# Patient Record
Sex: Female | Born: 1960 | Race: White | Hispanic: No | Marital: Married | Smoking: Former smoker
Health system: Southern US, Community
[De-identification: ages and names within clinical notes are randomized; demographics above are authoritative.]

## PROBLEM LIST (undated history)

## (undated) DIAGNOSIS — G35 Multiple sclerosis: Secondary | ICD-10-CM

## (undated) DIAGNOSIS — E785 Hyperlipidemia, unspecified: Secondary | ICD-10-CM

## (undated) DIAGNOSIS — E079 Disorder of thyroid, unspecified: Secondary | ICD-10-CM

## (undated) DIAGNOSIS — R011 Cardiac murmur, unspecified: Secondary | ICD-10-CM

## (undated) DIAGNOSIS — F32A Depression, unspecified: Secondary | ICD-10-CM

## (undated) DIAGNOSIS — T7840XA Allergy, unspecified, initial encounter: Secondary | ICD-10-CM

## (undated) DIAGNOSIS — C801 Malignant (primary) neoplasm, unspecified: Secondary | ICD-10-CM

## (undated) HISTORY — DX: Allergy, unspecified, initial encounter: T78.40XA

## (undated) HISTORY — DX: Depression, unspecified: F32.A

## (undated) HISTORY — DX: Multiple sclerosis: G35

## (undated) HISTORY — DX: Disorder of thyroid, unspecified: E07.9

## (undated) HISTORY — DX: Cardiac murmur, unspecified: R01.1

## (undated) HISTORY — DX: Hyperlipidemia, unspecified: E78.5

---

## 1998-07-29 HISTORY — PX: CHOLECYSTECTOMY: SHX55

## 1998-08-24 ENCOUNTER — Other Ambulatory Visit: Admission: RE | Admit: 1998-08-24 | Discharge: 1998-08-24 | Payer: Self-pay | Admitting: Obstetrics and Gynecology

## 2004-07-29 DIAGNOSIS — Z9089 Acquired absence of other organs: Secondary | ICD-10-CM

## 2004-07-29 DIAGNOSIS — E890 Postprocedural hypothyroidism: Secondary | ICD-10-CM

## 2004-07-29 HISTORY — DX: Postprocedural hypothyroidism: E89.0

## 2004-07-29 HISTORY — DX: Acquired absence of other organs: Z90.89

## 2005-04-02 ENCOUNTER — Encounter: Admission: RE | Admit: 2005-04-02 | Discharge: 2005-04-02 | Payer: Self-pay | Admitting: Obstetrics and Gynecology

## 2006-04-07 ENCOUNTER — Ambulatory Visit (HOSPITAL_COMMUNITY): Admission: RE | Admit: 2006-04-07 | Discharge: 2006-04-07 | Payer: Self-pay | Admitting: Obstetrics and Gynecology

## 2006-07-29 HISTORY — PX: OTHER SURGICAL HISTORY: SHX169

## 2006-08-06 ENCOUNTER — Other Ambulatory Visit: Admission: RE | Admit: 2006-08-06 | Discharge: 2006-08-06 | Payer: Self-pay | Admitting: Surgery

## 2006-09-08 ENCOUNTER — Encounter (INDEPENDENT_AMBULATORY_CARE_PROVIDER_SITE_OTHER): Payer: Self-pay | Admitting: Specialist

## 2006-09-08 ENCOUNTER — Ambulatory Visit (HOSPITAL_COMMUNITY): Admission: RE | Admit: 2006-09-08 | Discharge: 2006-09-09 | Payer: Self-pay | Admitting: Surgery

## 2007-03-30 ENCOUNTER — Ambulatory Visit: Payer: Self-pay | Admitting: Oncology

## 2007-04-07 ENCOUNTER — Ambulatory Visit: Payer: Self-pay | Admitting: Internal Medicine

## 2007-04-16 ENCOUNTER — Ambulatory Visit: Payer: Self-pay | Admitting: Internal Medicine

## 2007-04-27 ENCOUNTER — Ambulatory Visit: Payer: Self-pay | Admitting: Oncology

## 2007-04-29 ENCOUNTER — Ambulatory Visit: Payer: Self-pay | Admitting: Oncology

## 2007-05-30 ENCOUNTER — Ambulatory Visit: Payer: Self-pay | Admitting: Oncology

## 2007-12-11 DIAGNOSIS — Z86018 Personal history of other benign neoplasm: Secondary | ICD-10-CM

## 2007-12-11 HISTORY — DX: Personal history of other benign neoplasm: Z86.018

## 2008-07-01 ENCOUNTER — Encounter: Admission: RE | Admit: 2008-07-01 | Discharge: 2008-07-01 | Payer: Self-pay | Admitting: Obstetrics and Gynecology

## 2009-01-12 ENCOUNTER — Encounter: Admission: RE | Admit: 2009-01-12 | Discharge: 2009-01-12 | Payer: Self-pay | Admitting: Obstetrics and Gynecology

## 2009-05-26 DIAGNOSIS — T7840XA Allergy, unspecified, initial encounter: Secondary | ICD-10-CM

## 2009-05-26 HISTORY — DX: Allergy, unspecified, initial encounter: T78.40XA

## 2009-07-27 ENCOUNTER — Ambulatory Visit: Payer: Self-pay | Admitting: Family Medicine

## 2010-01-17 DIAGNOSIS — C4491 Basal cell carcinoma of skin, unspecified: Secondary | ICD-10-CM

## 2010-01-17 HISTORY — DX: Basal cell carcinoma of skin, unspecified: C44.91

## 2010-01-30 DIAGNOSIS — E669 Obesity, unspecified: Secondary | ICD-10-CM | POA: Insufficient documentation

## 2010-08-19 ENCOUNTER — Encounter: Payer: Self-pay | Admitting: Obstetrics and Gynecology

## 2010-11-10 ENCOUNTER — Ambulatory Visit: Payer: Self-pay | Admitting: Internal Medicine

## 2010-12-14 NOTE — Op Note (Signed)
Erica Noble, Erica Noble                ACCOUNT NO.:  [id number]   MEDICAL RECORD NO.:  [id number]          PATIENT TYPE:  OIB   LOCATION:  5705                         FACILITY:  [facility]   PHYSICIAN:  Velora Heckler, MD      DATE OF BIRTH:  [birth year]   DATE OF PROCEDURE:  09/08/2006  DATE OF DISCHARGE:                               OPERATIVE REPORT   PREOPERATIVE DIAGNOSIS:  Thyroid nodule with cytologic atypia.   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   PROCEDURE:  Left thyroid lobectomy.   SURGEON:  Velora Heckler, MD, FACS   ASSISTANT:  Francina Ames, MD, FACS   ANESTHESIA:  General.   ANESTHESIOLOGIST:  Kipp Brood, MD   ESTIMATED BLOOD LOSS:  Minimal.   PREPARATION:  Betadine.   COMPLICATIONS:  None.   INDICATIONS:  The patient is a 50 year old white female with known  thyroid nodule.  The patient was followed since October 2007.  In  January 2008, she underwent fine-needle aspiration cytology.  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  The patient  now comes to surgery for resection for definitive diagnosis.   BODY OF REPORT:  Procedure is done in OR #10 at the [facility].  The patient is brought to the operating room and  placed in a supine position on the operating room table.  Following the  administration of general anesthesia, the patient is positioned and then  prepped and draped in the usual strict aseptic fashion.  After  ascertaining that an adequate level of anesthesia had been obtained, a  Kocher incision is made with a #15 blade.  Dissection is carried down  through subcutaneous tissues and platysma.  Hemostasis is obtained with  the electrocautery.  Skin flaps are elevated cephalad and caudad from  the thyroid notch to the sternal notch.  The Mahorner self-retaining  retractor is placed for exposure.  Strap muscles are incised in the  midline and the dissection is  begun on the left side of the neck.  Upon  opening the strap muscles, there is a prominent inflammatory-appearing  lymph node just below the isthmus of the thyroid in the upper portion of  the central compartment.  It is excised and submitted as central  compartment lymph node.  Strap muscles are then reflected laterally and  the left thyroid lobe is exposed.  Lobe is slightly larger than normal.  It is quite firm.  It has a blanched appearance, all consistent with  thyroiditis.  No other lymphadenopathy is palpable.  Venous tributaries  are divided with the harmonic scalpel.  Lobe is gently dissected out  with a Barista.  Superior pole vessels are divided with medium  Ligaclips and use of the harmonic scalpel.  Gland is rolled anteriorly.  Branches of the inferior thyroid artery are dissected out and divided  with the harmonic scalpel and between small Ligaclips.  Recurrent nerve  is identified and preserved.  Superior parathyroid glands is identified  on the left and preserved.  Inferior parathyroid gland is  not identified  and likely was below the operative field.  Gland is rolled anteriorly.  Further branches of the inferior thyroid artery are divided between  small Ligaclips.  Ligament of Allyson Sabal is incised with the electrocautery,  taking care to avoid the recurrent nerve.  Isthmus of the gland is  mobilized across the midline.  There is no pyramidal lobe.  Isthmus is  transected at its junction with the right thyroid lobe using the  harmonic scalpel.  Specimen is submitted to Pathology, where Dr. Jimmy Picket confirms a diffuse thyroiditis.  The nodule has a thick fibrous  capsule.  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  No overt signs of malignancy  are identified.   The right lobe is inspected.  Strap muscles are reflected laterally and  the right lobe palpated.  It has an identical appearance and character  to the left lobe, all consistent with diffuse thyroiditis.  No  dominant  masses or nodules are identified on the right.  No lymphadenopathy is  present on the right.   Left neck is irrigated.  Surgicel is placed over the recurrent nerve and  parathyroid gland.  Strap muscles are reapproximated in the midline with  interrupted 3-0 Vicryl sutures.  Platysma is closed with interrupted 3-0  Vicryl sutures.  Skin is closed with a running 4-0 Vicryl subcuticular  suture.  Wound is washed and dried and Benzoin and Steri-Strips are  applied.  Sterile dressings are applied.  The patient is awakened from  anesthesia and brought to the recovery room in stable condition.  The  patient tolerated the procedure well.      Velora Heckler, MD  Electronically Signed     TMG/MEDQ  D:  09/08/2006  T:  09/09/2006  Job:  [id number]   cc:   Juluis Mire, M.D.

## 2011-03-23 ENCOUNTER — Ambulatory Visit: Payer: Self-pay | Admitting: Family Medicine

## 2011-07-18 ENCOUNTER — Other Ambulatory Visit: Payer: Self-pay | Admitting: Obstetrics and Gynecology

## 2011-07-18 DIAGNOSIS — R928 Other abnormal and inconclusive findings on diagnostic imaging of breast: Secondary | ICD-10-CM

## 2011-08-01 ENCOUNTER — Ambulatory Visit
Admission: RE | Admit: 2011-08-01 | Discharge: 2011-08-01 | Disposition: A | Discharge status: Home / Self Care | Payer: 59 | Source: Ambulatory Visit | Attending: Obstetrics and Gynecology | Admitting: Obstetrics and Gynecology

## 2011-08-01 DIAGNOSIS — R928 Other abnormal and inconclusive findings on diagnostic imaging of breast: Secondary | ICD-10-CM

## 2011-10-27 ENCOUNTER — Ambulatory Visit: Payer: Self-pay | Admitting: Family Medicine

## 2011-11-18 ENCOUNTER — Other Ambulatory Visit: Payer: Self-pay | Admitting: Gastroenterology

## 2011-12-20 ENCOUNTER — Other Ambulatory Visit: Payer: Self-pay | Admitting: Obstetrics and Gynecology

## 2011-12-20 DIAGNOSIS — N63 Unspecified lump in unspecified breast: Secondary | ICD-10-CM

## 2012-02-04 ENCOUNTER — Ambulatory Visit
Admission: RE | Admit: 2012-02-04 | Discharge: 2012-02-04 | Disposition: A | Discharge status: Home / Self Care | Payer: 59 | Source: Ambulatory Visit | Attending: Obstetrics and Gynecology | Admitting: Obstetrics and Gynecology

## 2012-02-04 DIAGNOSIS — N63 Unspecified lump in unspecified breast: Secondary | ICD-10-CM

## 2012-07-02 ENCOUNTER — Other Ambulatory Visit: Payer: Self-pay | Admitting: Obstetrics and Gynecology

## 2012-07-02 DIAGNOSIS — N63 Unspecified lump in unspecified breast: Secondary | ICD-10-CM

## 2012-07-29 HISTORY — PX: BREAST BIOPSY: SHX20

## 2012-08-10 ENCOUNTER — Ambulatory Visit
Admission: RE | Admit: 2012-08-10 | Discharge: 2012-08-10 | Disposition: A | Discharge status: Home / Self Care | Payer: 59 | Source: Ambulatory Visit | Attending: Obstetrics and Gynecology | Admitting: Obstetrics and Gynecology

## 2012-08-10 ENCOUNTER — Other Ambulatory Visit: Payer: Self-pay | Admitting: Obstetrics and Gynecology

## 2012-08-10 DIAGNOSIS — N63 Unspecified lump in unspecified breast: Secondary | ICD-10-CM

## 2012-12-31 LAB — HM HEPATITIS C SCREENING LAB: HM HEPATITIS C SCREENING: NEGATIVE

## 2013-07-16 ENCOUNTER — Other Ambulatory Visit: Payer: Self-pay

## 2013-07-16 DIAGNOSIS — Z1231 Encounter for screening mammogram for malignant neoplasm of breast: Secondary | ICD-10-CM

## 2013-08-11 ENCOUNTER — Ambulatory Visit
Admission: RE | Admit: 2013-08-11 | Discharge: 2013-08-11 | Disposition: A | Discharge status: Home / Self Care | Payer: 59 | Source: Ambulatory Visit

## 2013-08-11 DIAGNOSIS — Z1231 Encounter for screening mammogram for malignant neoplasm of breast: Secondary | ICD-10-CM

## 2013-09-22 ENCOUNTER — Encounter: Payer: Self-pay | Admitting: Neurology

## 2013-09-26 ENCOUNTER — Encounter: Payer: Self-pay | Admitting: Neurology

## 2013-10-27 ENCOUNTER — Encounter: Payer: Self-pay | Admitting: Neurology

## 2014-07-04 ENCOUNTER — Other Ambulatory Visit: Payer: Self-pay

## 2014-07-04 DIAGNOSIS — Z1231 Encounter for screening mammogram for malignant neoplasm of breast: Secondary | ICD-10-CM

## 2014-07-25 ENCOUNTER — Other Ambulatory Visit: Payer: Self-pay | Admitting: Obstetrics and Gynecology

## 2014-07-26 LAB — CYTOLOGY - PAP

## 2014-08-02 LAB — BASIC METABOLIC PANEL
BUN: 11 mg/dL (ref 4–21)
Creatinine: 0.7 mg/dL (ref 0.5–1.1)
Glucose: 116 mg/dL
POTASSIUM: 4.1 mmol/L (ref 3.4–5.3)
SODIUM: 139 mmol/L (ref 137–147)

## 2014-08-02 LAB — LIPID PANEL
CHOLESTEROL: 161 mg/dL (ref 0–200)
HDL: 59 mg/dL (ref 35–70)
LDL Cholesterol: 88 mg/dL
Triglycerides: 71 mg/dL (ref 40–160)

## 2014-08-02 LAB — CBC AND DIFFERENTIAL
HCT: 42 % (ref 36–46)
HEMOGLOBIN: 14.4 g/dL (ref 12.0–16.0)
Platelets: 241 10*3/uL (ref 150–399)
WBC: 7.9 10*3/mL

## 2014-08-02 LAB — TSH: TSH: 0.86 u[IU]/mL (ref 0.41–5.90)

## 2014-08-02 LAB — HEPATIC FUNCTION PANEL
ALT: 18 U/L (ref 7–35)
AST: 18 U/L (ref 13–35)

## 2014-08-12 ENCOUNTER — Ambulatory Visit
Admission: RE | Admit: 2014-08-12 | Discharge: 2014-08-12 | Disposition: A | Discharge status: Home / Self Care | Payer: 59 | Source: Ambulatory Visit

## 2014-08-12 DIAGNOSIS — Z1231 Encounter for screening mammogram for malignant neoplasm of breast: Secondary | ICD-10-CM

## 2015-01-22 LAB — LIPID PANEL
CHOLESTEROL: 156 mg/dL (ref 0–200)
HDL: 57 mg/dL (ref 35–70)
LDL CALC: 86 mg/dL
Triglycerides: 64 mg/dL (ref 40–160)

## 2015-01-22 LAB — HEMOGLOBIN A1C: HEMOGLOBIN A1C: 5.6

## 2015-03-09 ENCOUNTER — Ambulatory Visit: Payer: 59 | Attending: Neurology | Admitting: Physical Therapy

## 2015-03-09 ENCOUNTER — Encounter: Payer: Self-pay | Admitting: Physical Therapy

## 2015-03-09 DIAGNOSIS — M6281 Muscle weakness (generalized): Secondary | ICD-10-CM | POA: Diagnosis present

## 2015-03-09 DIAGNOSIS — R262 Difficulty in walking, not elsewhere classified: Secondary | ICD-10-CM | POA: Diagnosis present

## 2015-03-09 NOTE — Therapy (Signed)
[facility] [address] [place], [place], [address] Phone: [phone]   Fax:  [phone]  Physical Therapy Evaluation  Patient Details  Name: Erica Noble MRN: [id number] Date of Birth: 20-Apr-1961 Referring Provider:  Vladimir Crofts, MD  Encounter Date: 03/09/2015      PT End of Session - 03/10/15 1544    Visit Number 1   Number of Visits 8   Date for PT Re-Evaluation 04/06/15   PT Start Time 4580   PT Stop Time 1617   PT Time Calculation (min) 56 min   Equipment Utilized During Treatment Gait belt   Activity Tolerance Patient tolerated treatment well;Patient limited by fatigue   Behavior During Therapy [facility] for tasks assessed/performed      Past Medical History  Diagnosis Date  . Multiple sclerosis     2008 diagnosis    No past surgical history on file.  There were no vitals filed for this visit.  Visit Diagnosis:  Muscle weakness  Difficulty walking      Subjective Assessment - 03/09/15 1531    Subjective Pt. reports falling 1 month ago while walking on driveway with no assistive device.  Pt. has h/o progressive worsening gait due to MS diagnosis.  Several falls reported and pt. not using assistive device at this time.    Patient is accompained by: Family member   Limitations Lifting;Standing;Walking   Patient Stated Goals improve LE muscle strength/ walking/ safety.   Currently in Pain? No/denies            [facility] PT Assessment - 03/10/15 0001    Assessment   Medical Diagnosis multiple sclerosis   Onset Date/Surgical Date 02/27/07   Prior Therapy see PT notes from 11/2013   Balance Screen   Has the patient fallen in the past 6 months Yes   How many times? 3   Has the patient had a decrease in activity level because of a fear of falling?  Yes   Is the patient reluctant to leave their home because of a fear of falling?  Yes      Berg: 38/56        PT Education - 03/10/15 1543    Education  provided Yes   Education Details HEP and instructed in proper assistive device (rollator) with all walking tasks inside/outside.    Person(s) Educated Patient   Methods Explanation;Demonstration;Handout   Comprehension Verbalized understanding;Returned demonstration;Tactile cues required;Need further instruction             PT Long Term Goals - 03/10/15 1554    PT LONG TERM GOAL #1   Title Pt. I with HEP to increase B LE muscle strength to grossly 4+/5 MMT to improve hip flexion/ gait pattern/ prevent falls.     Time 4   Period Weeks   Status New   PT LONG TERM GOAL #2   Title Pt. able to ambulate community distances with consistent hip flexion/ step pattern and length with least assistive device on level surfaces safely.     Time 4   Period Weeks   Status New   PT LONG TERM GOAL #3   Title Pt. will increase Berg balance test to >44 out of 56 to decrease fall risk/ promote safety with walkiing.    Baseline 38/56 on 8/11   Time 4   Period Weeks   Status New               Plan -  03/10/15 1546    Clinical Impression Statement Pt. is a pleasant 54 y/o female with progressive worsening gait/ h/o falls.  Pt. reports last fall on driveway 1 month ago without injury.  Pt. presents with generalized B LE muscle weakness: R LE muscle strength grossly 4+/5 and L LE 4/5 MMT except B DF/hip flexion grossly 3/5 MMT (limited hip flexion during gait/ step pattern).  Pt. scored a 38/56 on Berg balance test and benefits greatly from use of rollator.  Pt. ambulates with shuffling/festinating gait pattern with poor hip flexion/ step pattern/ heel strike.  Poor ability to turn without UE assist/ device in clinic.  Pt. will benefit from skilled PT services to increase B LE muscle strength to improve gait pattern/ safely/ use of appropriate assistive device.      Pt will benefit from skilled therapeutic intervention in order to improve on the following deficits Abnormal gait;Decreased  endurance;Obesity;Decreased activity tolerance;Pain;Impaired UE functional use;Decreased strength;Decreased balance;Decreased mobility;Difficulty walking;Improper body mechanics;Decreased range of motion;Decreased coordination;Decreased safety awareness;Postural dysfunction   Rehab Potential Fair   PT Frequency 2x / week   PT Duration 4 weeks   PT Treatment/Interventions ADLs/Self Care Home Management;Neuromuscular re-education;Aquatic Therapy;Passive range of motion;Patient/family education;Gait training;Stair training;Functional mobility training;Therapeutic activities;Therapeutic exercise;Balance training;Manual techniques;Energy conservation   PT Next Visit Plan Reassess use of rollator/ balance training/ B LE strengthening ex.    PT Home Exercise Plan see handouts.    Recommended Other Services walking program.    Consulted and Agree with Plan of Care Patient         Problem List There are no active problems to display for this patient.  Pura Spice, PT, DPT # [phone]   03/10/2015, 4:01 PM  [place] [facility] [address] [place], [place], [address] Phone: [phone]   Fax:  [phone]

## 2015-03-17 DIAGNOSIS — B029 Zoster without complications: Secondary | ICD-10-CM | POA: Insufficient documentation

## 2015-03-17 DIAGNOSIS — Z79899 Other long term (current) drug therapy: Secondary | ICD-10-CM | POA: Insufficient documentation

## 2015-03-17 DIAGNOSIS — R5381 Other malaise: Secondary | ICD-10-CM | POA: Insufficient documentation

## 2015-03-17 DIAGNOSIS — E559 Vitamin D deficiency, unspecified: Secondary | ICD-10-CM | POA: Insufficient documentation

## 2015-03-17 DIAGNOSIS — E039 Hypothyroidism, unspecified: Secondary | ICD-10-CM | POA: Insufficient documentation

## 2015-03-17 DIAGNOSIS — Z86010 Personal history of colonic polyps: Secondary | ICD-10-CM | POA: Insufficient documentation

## 2015-03-17 DIAGNOSIS — R5383 Other fatigue: Secondary | ICD-10-CM

## 2015-03-17 DIAGNOSIS — N951 Menopausal and female climacteric states: Secondary | ICD-10-CM | POA: Insufficient documentation

## 2015-03-17 DIAGNOSIS — E785 Hyperlipidemia, unspecified: Secondary | ICD-10-CM | POA: Insufficient documentation

## 2015-03-17 DIAGNOSIS — G35 Multiple sclerosis: Secondary | ICD-10-CM | POA: Insufficient documentation

## 2015-03-17 DIAGNOSIS — E079 Disorder of thyroid, unspecified: Secondary | ICD-10-CM | POA: Insufficient documentation

## 2015-03-22 ENCOUNTER — Ambulatory Visit: Payer: 59 | Admitting: Physical Therapy

## 2015-03-22 ENCOUNTER — Encounter: Payer: Self-pay | Admitting: Physical Therapy

## 2015-03-22 ENCOUNTER — Encounter: Payer: Self-pay | Admitting: Family Medicine

## 2015-03-22 DIAGNOSIS — M6281 Muscle weakness (generalized): Secondary | ICD-10-CM

## 2015-03-22 DIAGNOSIS — R262 Difficulty in walking, not elsewhere classified: Secondary | ICD-10-CM

## 2015-03-22 NOTE — Therapy (Signed)
[facility] [address] [place], [place], [address] Phone: [phone]   Fax:  [phone]  Physical Therapy Treatment  Patient Details  Name: Erica Noble MRN: [id number] Date of Birth: 04-30-1961 Referring Provider:  Vladimir Crofts, MD  Encounter Date: 03/22/2015      PT End of Session - 03/22/15 1732    Visit Number 2   Number of Visits 8   Date for PT Re-Evaluation 04/05/15   PT Start Time 1301   PT Stop Time 6948   PT Time Calculation (min) 53 min   Equipment Utilized During Treatment Gait belt   Activity Tolerance Patient tolerated treatment well;No increased pain;Patient limited by fatigue   Behavior During Therapy [facility] for tasks assessed/performed      Past Medical History  Diagnosis Date  . Multiple sclerosis     2008 diagnosis  . Allergy 05/26/2009    Allergic rhinitis  . Hyperlipidemia     Past Surgical History  Procedure Laterality Date  . History of thyroidectomy  2008  . Cholecystectomy  2000    There were no vitals filed for this visit.  Visit Diagnosis:  Muscle weakness  Difficulty walking      Subjective Assessment - 03/22/15 1352    Subjective Pt reports no falls since last PT tx session. Pt reports not using an AD inside the house or when walking her driveway. Pt reports no pain and per pt's husband, pt is doing well overall. Pt has not attempted to get rollator since getting the RX from her MD.   Patient is accompained by: Family member   Limitations Lifting;Standing;Walking   Currently in Pain? No/denies        OBJECTIVE: Gait training: cones on alternating sides for step length cues with CGA x 8 (emphasis on heel strike and decrease festinating gait pattern).  Turns around cones with CGA to decrease shuffling pattern noticed. Gait training around the gym with CGA with minimal carryover from visual cone cues (pt required constant verbal cues for heel strike and no shuffling gait). Gait  to the car with moderate verbal cuing and CGA to increase step length and heel strike. There ex: steps overs 3" step in // bars with one UE support/ step ups onto 3" step in // bars with one UE support x 10 each (pt quick to fatigue with step ups). Nu step for 10 minutes at L6 (slow speed and encouragement to keep a steady speed for whole time, no charge). Neuro re-ed: balance on airex with EO/EC 30 seconds x 3 (no LOB but postural sway noted with EC, pt reported feeling unsteady with EC). Balance on airex with dynamic UE movement to catch and throw the ball x 3 mins (no LOB).   Pt response to Tx for medical necessity: Pt continues to benefit from balance and gait training to improve her safety with functional mobility and maintain her current functional level with the progressive nature of MS.         PT Education - 03/22/15 1731    Education provided Yes   Education Details Pt encouraged to ambulate with SPC until she gets the rollator instead of furniture surfing at home. Pt educated on importance of not walking without an AD due to gait pattern and balance concerns.    Person(s) Educated Patient;Spouse   Methods Explanation   Comprehension Verbalized understanding           PT Long Term Goals - 03/10/15  [place] GOAL #1   Title Pt. I with HEP to increase B LE muscle strength to grossly 4+/5 MMT to improve hip flexion/ gait pattern/ prevent falls.     Time 4   Period Weeks   Status New   PT LONG TERM GOAL #2   Title Pt. able to ambulate community distances with consistent hip flexion/ step pattern and length with least assistive device on level surfaces safely.     Time 4   Period Weeks   Status New   PT LONG TERM GOAL #3   Title Pt. will increase Berg balance test to >44 out of 56 to decrease fall risk/ promote safety with walkiing.    Baseline 38/56 on 8/11   Time 4   Period Weeks   Status New           Plan - 03/22/15 1733    Clinical Impression Statement  Pt limited in endurance with gait and standing actitivies today. Pt reports doing better in the AM so appointments shifted to AM. Pt ambulates with shuffled, step to, festinating gait pattern without verbal and visual cues to lengthen step length. Moderate carryover without cues. Pt demonstrated proper balance responses to balance challenges on airex pad. Pt educated on importance of using AD at home. Pt continues to benefit from PT to address balance and gait deficits.   Pt will benefit from skilled therapeutic intervention in order to improve on the following deficits Abnormal gait;Difficulty walking;Decreased coordination;Decreased safety awareness;Decreased endurance;Decreased activity tolerance;Decreased balance;Decreased mobility;Decreased strength;Improper body mechanics   Rehab Potential Fair   PT Frequency 2x / week   PT Duration 4 weeks   PT Treatment/Interventions ADLs/Self Care Home Management;Cryotherapy;Moist Heat;Balance training;Therapeutic exercise;Therapeutic activities;Functional mobility training;Stair training;Gait training;Patient/family education;Neuromuscular re-education;Passive range of motion;Manual techniques   PT Next Visit Plan reassess gait pattern/continue to work on gait and balance exercises, attempt treadmill before pt tries at home.   PT Home Exercise Plan continue current HEP progress at next session   Recommended Other Services aquatic therapy, walking program    Consulted and Agree with Plan of Care Patient        Problem List Patient Active Problem List   Diagnosis Date Noted  . Polypharmacy 03/17/2015  . History of colon polyps 03/17/2015  . HLD (hyperlipidemia) 03/17/2015  . Adult hypothyroidism 03/17/2015  . Malaise and fatigue 03/17/2015  . DS (disseminated sclerosis) 03/17/2015  . Post menopausal syndrome 03/17/2015  . Herpes zona 03/17/2015  . Disease of thyroid gland 03/17/2015  . Avitaminosis D 03/17/2015  . Back ache 01/30/2010  .  Adiposity 01/30/2010  . Acquired hypothyroidism 05/26/2009  . Allergic rhinitis 05/26/2009   Lavone Neri, SPT  03/22/2015, 5:53 PM  [facility] [address] [place], [place], [address] Phone: [phone]   Fax:  [phone]

## 2015-03-29 ENCOUNTER — Encounter: Payer: Self-pay | Admitting: Physical Therapy

## 2015-03-29 ENCOUNTER — Ambulatory Visit: Payer: 59 | Admitting: Physical Therapy

## 2015-03-29 DIAGNOSIS — M6281 Muscle weakness (generalized): Secondary | ICD-10-CM | POA: Diagnosis not present

## 2015-03-29 DIAGNOSIS — R262 Difficulty in walking, not elsewhere classified: Secondary | ICD-10-CM

## 2015-03-29 NOTE — Therapy (Signed)
[facility] [address] [place], [place], [address] Phone: [phone]   Fax:  [phone]  Physical Therapy Treatment  Patient Details  Name: ODESSER TOURANGEAU MRN: [id number] Date of Birth: 1961-04-23 Referring Provider:  Vladimir Crofts, MD  Encounter Date: 03/29/2015      PT End of Session - 03/29/15 1230    Visit Number 3   Number of Visits 8   Date for PT Re-Evaluation 04/05/15   PT Start Time 1014   PT Stop Time 1101   PT Time Calculation (min) 47 min   Equipment Utilized During Treatment Gait belt   Activity Tolerance Patient tolerated treatment well;Patient limited by fatigue   Behavior During Therapy [facility] for tasks assessed/performed      Past Medical History  Diagnosis Date  . Multiple sclerosis     2008 diagnosis  . Allergy 05/26/2009    Allergic rhinitis  . Hyperlipidemia     Past Surgical History  Procedure Laterality Date  . History of thyroidectomy  2008  . Cholecystectomy  2000    There were no vitals filed for this visit.  Visit Diagnosis:  Muscle weakness  Difficulty walking      Subjective Assessment - 03/29/15 1102    Subjective Pt reports no falls since last PT tx session. Pt reports not using an AD inside the house or when walking her driveway. Pt reports no pain but fatigue today upon arrival to PT session. Pt states she feels like a rollator is cumbersome and not worth using. Pt encouraged to use rollator for all functional mobility.    Patient is accompained by: Family member   Limitations Lifting;Standing;Walking   Patient Stated Goals improve LE muscle strength/ walking/ safety.   Currently in Pain? No/denies       OBJECTIVE: There ex: Nustep L6 5 mins warm up focusing on R knee extension (no charge). Side step overs 3" step in // bars with B UE support (emphasis on toes staying forward and larger step length/increased hip flexion and abduction). In // bars, walking marching to  increase hip flexion and knee flexion. Gait: In // bars with no UE support, fast walking and then controlled heel to toe gait pattern emphasis to decrease shuffling/foot drag. In the hallway, gait with rollator and CGA (emphasis on heel strike, hip flexion; poor carryover without verbal cuing). Gait outside on the asphalt with rollator and CGA to work on [employer] (cuing to decrease toe drag and equal step length, poor carryover).  Standing rocker board L/R/ forward/backwards with UE assist working on wt. Shifting/ posture.   Pt response for medical necessity: Pt ambulates safer and at a functional gait speed with rollator. Continue to work to decrease shuffling gait and increase balance and strength to increase independence and safety with mobility.        PT Education - 03/29/15 1103    Education provided Yes   Education Details Pt educated on importance of using a rollator for functional mobility and safety.    Person(s) Educated Patient;Spouse   Methods Explanation;Demonstration;Verbal cues   Comprehension Returned demonstration;Verbalized understanding;Need further instruction            PT Long Term Goals - 03/10/15 1554    PT LONG TERM GOAL #1   Title Pt. I with HEP to increase B LE muscle strength to grossly 4+/5 MMT to improve hip flexion/ gait pattern/ prevent falls.     Time 4   Period Weeks  Status New   PT LONG TERM GOAL #2   Title Pt. able to ambulate community distances with consistent hip flexion/ step pattern and length with least assistive device on level surfaces safely.     Time 4   Period Weeks   Status New   PT LONG TERM GOAL #3   Title Pt. will increase Berg balance test to >44 out of 56 to decrease fall risk/ promote safety with walkiing.    Baseline 38/56 on 8/11   Time 4   Period Weeks   Status New            Plan - 03/29/15 1109    Clinical Impression Statement Pt ambulates with no assistive device with decreased knee and hip flexion. Pt  ambulates with decreased dorsiflexion and decreased stride and step length bilaterally. With Rollator and CGA, pt ambulates with increased cadence and step through gait pattern. Pt inconsistently demonstrates increased dorsiflexion and knee flexion in order to clear her toes. Without verbal cuing, pt returns to shuffled/festinating gait pattern. With the rollator pt is able to turn inconsistently without shuffling her feet. Without verbal cues, pt returns to gait pattern of preference. Pt demonstrates lack of ability to properly weight shift on rocker board from side to side without UE support and at a normal gait speed.    Pt will benefit from skilled therapeutic intervention in order to improve on the following deficits Abnormal gait;Difficulty walking;Decreased range of motion;Decreased coordination;Decreased endurance;Decreased balance;Decreased activity tolerance;Decreased mobility;Decreased strength;Obesity;Hypomobility   Rehab Potential Fair   PT Frequency 1x / week   PT Duration 4 weeks   PT Treatment/Interventions ADLs/Self Care Home Management;Cryotherapy;Moist Heat;Balance training;Therapeutic exercise;Therapeutic activities;Manual techniques;Functional mobility training;Stair training;Gait training;Patient/family education;Neuromuscular re-education;Passive range of motion   PT Next Visit Plan progress gait with rollator/balance and strengthening espeically L LE/CALL NEXT Wednesday to follow up   PT Home Exercise Plan continue walking and home program    Consulted and Agree with Plan of Care Patient;Family member/caregiver        Problem List Patient Active Problem List   Diagnosis Date Noted  . Polypharmacy 03/17/2015  . History of colon polyps 03/17/2015  . HLD (hyperlipidemia) 03/17/2015  . Adult hypothyroidism 03/17/2015  . Malaise and fatigue 03/17/2015  . DS (disseminated sclerosis) 03/17/2015  . Post menopausal syndrome 03/17/2015  . Herpes zona 03/17/2015  . Disease of  thyroid gland 03/17/2015  . Avitaminosis D 03/17/2015  . Back ache 01/30/2010  . Adiposity 01/30/2010  . Acquired hypothyroidism 05/26/2009  . Allergic rhinitis 05/26/2009    Pura Spice, SPT 03/29/2015, 12:43 PM  [facility] [address] [place], [place], [address] Phone: [phone]   Fax:  [phone]

## 2015-05-03 ENCOUNTER — Ambulatory Visit (INDEPENDENT_AMBULATORY_CARE_PROVIDER_SITE_OTHER): Payer: 59 | Admitting: Family Medicine

## 2015-05-03 VITALS — BP 126/72 | HR 62 | Temp 98.3°F | Resp 14 | Ht 65.0 in | Wt 231.0 lb

## 2015-05-03 DIAGNOSIS — Z6838 Body mass index (BMI) 38.0-38.9, adult: Secondary | ICD-10-CM | POA: Diagnosis not present

## 2015-05-03 DIAGNOSIS — G35 Multiple sclerosis: Secondary | ICD-10-CM

## 2015-05-03 NOTE — Progress Notes (Signed)
Patient ID: Erica Noble, female   DOB: 05-19-1961, 54 y.o.   MRN: [id number]   Erica Noble  MRN: [id number] DOB: 10-29-1960  Subjective:  HPI  1. BMI 38.0-38.9,adult Patient presents for paperwork to be filled out for her insurance company due to being over on her BMI.   Patient Active Problem List   Diagnosis Date Noted  . Polypharmacy 03/17/2015  . History of colon polyps 03/17/2015  . HLD (hyperlipidemia) 03/17/2015  . Adult hypothyroidism 03/17/2015  . Malaise and fatigue 03/17/2015  . DS (disseminated sclerosis) ([place]) 03/17/2015  . Post menopausal syndrome 03/17/2015  . Herpes zona 03/17/2015  . Disease of thyroid gland 03/17/2015  . Avitaminosis D 03/17/2015  . Back ache 01/30/2010  . Adiposity 01/30/2010  . Acquired hypothyroidism 05/26/2009  . Allergic rhinitis 05/26/2009    Past Medical History  Diagnosis Date  . Multiple sclerosis     2008 diagnosis  . Allergy 05/26/2009    Allergic rhinitis  . Hyperlipidemia     Social History   Social History  . Marital Status: Married    Spouse Name: N/A  . Number of Children: N/A  . Years of Education: N/A   Occupational History  . Not on file.   Social History Main Topics  . Smoking status: Former Smoker    Quit date: 07/29/1986  . Smokeless tobacco: Not on file  . Alcohol Use: No  . Drug Use: No  . Sexual Activity: Not on file   Other Topics Concern  . Not on file   Social History Narrative    Outpatient Prescriptions Prior to Visit  Medication Sig Dispense Refill  . levothyroxine (SYNTHROID) 150 MCG tablet     . levothyroxine (SYNTHROID, LEVOTHROID) 125 MCG tablet Take 125 mcg by mouth daily before breakfast.     No facility-administered medications prior to visit.    No Known Allergies  Review of Systems  Constitutional: Positive for malaise/fatigue (chronic, secondary to MS, no change).  HENT: Negative.   Eyes: Negative.   Respiratory: Negative.   Cardiovascular: Negative.    Gastrointestinal: Negative.   Neurological: Positive for dizziness (chroni, secondary to MS, unchanged).  Psychiatric/Behavioral: Negative.    Objective:  BP 126/72 mmHg  Pulse 62  Temp(Src) 98.3 F (36.8 C) (Oral)  Resp 14  Ht 5\' 5"  (1.651 m)  Wt 231 lb (104.781 kg)  BMI 38.44 kg/m2  Physical Exam  Constitutional: She is oriented to person, place, and time and well-developed, well-nourished, and in no distress.  HENT:  Head: Normocephalic and atraumatic.  Right Ear: External ear normal.  Left Ear: External ear normal.  Nose: Nose normal.  Eyes: Conjunctivae are normal.  Neck: Neck supple.  Cardiovascular: Normal rate, regular rhythm and normal heart sounds.   Pulmonary/Chest: Effort normal and breath sounds normal.  Abdominal: Soft.  Neurological: She is alert and oriented to person, place, and time.  Skin: Skin is warm and dry.  Psychiatric: Mood, memory, affect and judgment normal.    Assessment and Plan :  BMI 38.0-38.9,adult 1. BMI 38.0-38.9,adult Dietary portions stressed as exercise is limited due to MS. 2MS  I have done the exam and reviewed the above chart and it is accurate to the best of my knowledge.   Miguel Aschoff MD [facility] 05/03/2015 3:46 PM

## 2015-05-05 ENCOUNTER — Encounter: Payer: Self-pay | Admitting: Family Medicine

## 2015-05-10 ENCOUNTER — Encounter: Payer: Self-pay | Admitting: Family Medicine

## 2015-08-03 ENCOUNTER — Other Ambulatory Visit: Payer: Self-pay

## 2015-12-05 ENCOUNTER — Other Ambulatory Visit: Payer: Self-pay | Admitting: Family Medicine

## 2016-01-20 ENCOUNTER — Telehealth: Payer: Self-pay | Admitting: Family Medicine

## 2016-01-20 NOTE — Telephone Encounter (Signed)
Pt would like to get a lab slip to have her thyroid rechecked. Please advise. Thanks TNP

## 2016-01-20 NOTE — Telephone Encounter (Signed)
Needs appointment

## 2016-01-20 NOTE — Telephone Encounter (Signed)
Ok to order? Please advise. Thanks!  

## 2016-01-22 NOTE — Telephone Encounter (Signed)
Pt advised and appt made-aa 

## 2016-01-24 ENCOUNTER — Ambulatory Visit (INDEPENDENT_AMBULATORY_CARE_PROVIDER_SITE_OTHER): Payer: 59 | Admitting: Family Medicine

## 2016-01-24 ENCOUNTER — Encounter: Payer: Self-pay | Admitting: Family Medicine

## 2016-01-24 ENCOUNTER — Other Ambulatory Visit: Payer: Self-pay | Admitting: Family Medicine

## 2016-01-24 VITALS — BP 124/78 | HR 78 | Temp 98.0°F | Resp 16 | Wt 222.0 lb

## 2016-01-24 DIAGNOSIS — R7303 Prediabetes: Secondary | ICD-10-CM

## 2016-01-24 DIAGNOSIS — R5383 Other fatigue: Secondary | ICD-10-CM

## 2016-01-24 DIAGNOSIS — E669 Obesity, unspecified: Secondary | ICD-10-CM

## 2016-01-24 DIAGNOSIS — E039 Hypothyroidism, unspecified: Secondary | ICD-10-CM

## 2016-01-24 DIAGNOSIS — G35 Multiple sclerosis: Secondary | ICD-10-CM | POA: Diagnosis not present

## 2016-01-24 NOTE — Progress Notes (Signed)
       Patient: Erica Noble Female    DOB: 12/19/1960   55 y.o.   MRN: [id number] Visit Date: 01/24/2016  Today's Provider: Wilhemena Durie, MD   Chief Complaint  Patient presents with  . Hypothyroidism   Subjective:    HPI Patient comes in today to follow up on hypothyroidism. Patient was last seen on 05/03/2015, but her thyroid level has not been checked since 07/2014. Patient denies any symptoms/issues while on Synthroid 113mcg daily.      No Known Allergies Current Meds  Medication Sig  . SYNTHROID 150 MCG tablet Take 1 tablet by mouth  daily    Review of Systems  Constitutional: Negative.   HENT: Negative.   Respiratory: Negative.   Cardiovascular: Negative.   Gastrointestinal: Negative.   Endocrine: Negative.   Musculoskeletal: Negative.   Neurological: Negative.        Patient has MS and she has had mild progressive left-sided weakness with this.  Psychiatric/Behavioral: Negative.     Social History  Substance Use Topics  . Smoking status: Former Smoker    Quit date: 07/29/1986  . Smokeless tobacco: Not on file  . Alcohol Use: No   Objective:   BP 124/78 mmHg  Pulse 78  Temp(Src) 98 F (36.7 C)  Resp 16  Wt 222 lb (100.699 kg)  Physical Exam  Constitutional: She is oriented to person, place, and time. She appears well-developed and well-nourished.   Mildly Obese white female in no acute distress.  HENT:  Head: Normocephalic and atraumatic.  Right Ear: External ear normal.  Left Ear: External ear normal.  Nose: Nose normal.  Eyes: Conjunctivae are normal.  Neck: Neck supple.  Cardiovascular: Normal rate, regular rhythm and normal heart sounds.   Pulmonary/Chest: Effort normal and breath sounds normal.  Abdominal: Soft.  Neurological: She is alert and oriented to person, place, and time.  Skin: Skin is warm and dry.  Psychiatric: She has a normal mood and affect. Her behavior is normal. Judgment and thought content normal.          Assessment & Plan:     1. Hypothyroidism, unspecified hypothyroidism type  - TSH  2. Pre-diabetes  - Hemoglobin A1c  3. Other fatigue "Normal" per pt. - CBC with Differential/Platelet  4. MS (multiple sclerosis) ([place]) Followed by neurology.  5. Obesity Dietary changes discussed at length with patient. - Comprehensive metabolic panel CPE later 0000000.       Raynard Mapps [place] Mon, MD  [facility]

## 2016-01-25 LAB — CBC WITH DIFFERENTIAL/PLATELET
BASOS: 1 %
Basophils Absolute: 0 10*3/uL (ref 0.0–0.2)
EOS (ABSOLUTE): 0.1 10*3/uL (ref 0.0–0.4)
EOS: 1 %
HEMATOCRIT: 42 % (ref 34.0–46.6)
HEMOGLOBIN: 14.4 g/dL (ref 11.1–15.9)
IMMATURE GRANS (ABS): 0 10*3/uL (ref 0.0–0.1)
IMMATURE GRANULOCYTES: 0 %
LYMPHS: 31 %
Lymphocytes Absolute: 1.8 10*3/uL (ref 0.7–3.1)
MCH: 32.5 pg (ref 26.6–33.0)
MCHC: 34.3 g/dL (ref 31.5–35.7)
MCV: 95 fL (ref 79–97)
MONOCYTES: 9 %
Monocytes Absolute: 0.5 10*3/uL (ref 0.1–0.9)
NEUTROS ABS: 3.4 10*3/uL (ref 1.4–7.0)
NEUTROS PCT: 58 %
PLATELETS: 219 10*3/uL (ref 150–379)
RBC: 4.43 x10E6/uL (ref 3.77–5.28)
RDW: 13 % (ref 12.3–15.4)
WBC: 5.8 10*3/uL (ref 3.4–10.8)

## 2016-01-25 LAB — COMPREHENSIVE METABOLIC PANEL
A/G RATIO: 2 (ref 1.2–2.2)
ALBUMIN: 4.5 g/dL (ref 3.5–5.5)
ALT: 19 IU/L (ref 0–32)
AST: 14 IU/L (ref 0–40)
Alkaline Phosphatase: 64 IU/L (ref 39–117)
BILIRUBIN TOTAL: 0.5 mg/dL (ref 0.0–1.2)
BUN / CREAT RATIO: 16 (ref 9–23)
BUN: 11 mg/dL (ref 6–24)
CALCIUM: 9.1 mg/dL (ref 8.7–10.2)
CHLORIDE: 103 mmol/L (ref 96–106)
CO2: 25 mmol/L (ref 18–29)
Creatinine, Ser: 0.7 mg/dL (ref 0.57–1.00)
GFR, EST AFRICAN AMERICAN: 114 mL/min/{1.73_m2} (ref >59)
GFR, EST NON AFRICAN AMERICAN: 99 mL/min/{1.73_m2} (ref >59)
Globulin, Total: 2.3 g/dL (ref 1.5–4.5)
Glucose: 100 mg/dL — ABNORMAL HIGH (ref 65–99)
POTASSIUM: 4.1 mmol/L (ref 3.5–5.2)
Sodium: 144 mmol/L (ref 134–144)
TOTAL PROTEIN: 6.8 g/dL (ref 6.0–8.5)

## 2016-01-25 LAB — HEMOGLOBIN A1C
Est. average glucose Bld gHb Est-mCnc: 97 mg/dL
Hgb A1c MFr Bld: 5 % (ref 4.8–5.6)

## 2016-01-25 LAB — TSH: TSH: 0.083 u[IU]/mL — AB (ref 0.450–4.500)

## 2016-01-26 ENCOUNTER — Telehealth: Payer: Self-pay

## 2016-01-26 MED ORDER — SYNTHROID 112 MCG PO TABS: 112.0000 ug | ORAL_TABLET | Freq: Every day | ORAL | Status: DC

## 2016-01-26 NOTE — Telephone Encounter (Signed)
See lab results-aa

## 2016-01-26 NOTE — Telephone Encounter (Signed)
-----   Message from Jerrol Banana., MD sent at 01/26/2016 10:45 AM EDT ----- Thyroid overtreated. Decrease Synthroid from 125-112 g daily. Follow-up 3-6 months and will follow-up TSH then

## 2016-01-26 NOTE — Telephone Encounter (Signed)
Pt is returning [place] call about her lab results. Thanks TNP

## 2016-01-26 NOTE — Telephone Encounter (Signed)
Pt advised and RX sent in-aa 

## 2016-05-18 ENCOUNTER — Ambulatory Visit
Admission: EM | Admit: 2016-05-18 | Discharge: 2016-05-18 | Disposition: A | Discharge status: Home / Self Care | Payer: 59 | Attending: Registered Nurse | Admitting: Registered Nurse

## 2016-05-18 DIAGNOSIS — N611 Abscess of the breast and nipple: Secondary | ICD-10-CM

## 2016-05-18 DIAGNOSIS — N61 Mastitis without abscess: Secondary | ICD-10-CM | POA: Diagnosis not present

## 2016-05-18 DIAGNOSIS — L98499 Non-pressure chronic ulcer of skin of other sites with unspecified severity: Secondary | ICD-10-CM

## 2016-05-18 MED ORDER — SULFAMETHOXAZOLE-TRIMETHOPRIM 800-160 MG PO TABS: 1.0000 | ORAL_TABLET | Freq: Two times a day (BID) | ORAL | 0 refills | Status: DC

## 2016-05-18 MED ORDER — MUPIROCIN 2 % EX OINT: 1.0000 "application " | TOPICAL_OINTMENT | Freq: Two times a day (BID) | CUTANEOUS | 0 refills | Status: DC

## 2016-05-18 NOTE — ED Triage Notes (Signed)
Patient fell on Thursday and she noticed that she has a quarter size wound that is leaking a white discharge.

## 2016-05-18 NOTE — ED Provider Notes (Signed)
CSN: [id number]     Arrival date & time 05/18/16  1337 History   First MD Initiated Contact with Patient 05/18/16 1423     Chief Complaint  Patient presents with  . Breast Problem    right breast   (Consider location/radiation/quality/duration/timing/severity/associated sxs/prior Treatment) Married caucasian female here for evaluation of sore right breast noticed today.  Fell earlier this week on top of laundry basket. Prior to fall had noticed hard pimple spot earlier this week and though it would come to a head and drain.  Occasional self breast exams not monthly had not noticed changes  Mammogram normal 2016  Has appt pending with [facility] Dr Rosanna Randy Wednesday 25 Oct reported frequent vertigo but falls not common typically uses walker/assist device  Accompanied by spouse today  Denied fever/chills/nausea/vomiting/fatigue/myalgias  PMHx MS on [place], hypothyroidism due to surgical removal FHx adopted unknown      Past Medical History:  Diagnosis Date  . Allergy 05/26/2009   Allergic rhinitis  . Hyperlipidemia   . Multiple sclerosis ([place])    2008 diagnosis   Past Surgical History:  Procedure Laterality Date  . CHOLECYSTECTOMY  2000  . history of thyroidectomy  2008   Family History  Problem Relation Age of Onset  . Adopted: Yes  . Family history unknown: Yes   Social History  Substance Use Topics  . Smoking status: Former Smoker    Quit date: 07/29/1986  . Smokeless tobacco: Never Used  . Alcohol use No   OB History    No data available     Review of Systems  Constitutional: Negative for activity change, appetite change, chills, diaphoresis, fatigue and fever.  HENT: Negative for ear pain and sore throat.   Eyes: Negative for pain and visual disturbance.  Respiratory: Negative for cough and shortness of breath.   Cardiovascular: Negative for chest pain and palpitations.  Gastrointestinal: Negative for abdominal pain and vomiting.  Endocrine: Negative for cold  intolerance and heat intolerance.  Genitourinary: Negative for dysuria and hematuria.  Musculoskeletal: Negative for arthralgias, back pain, gait problem, joint swelling, myalgias, neck pain and neck stiffness.  Skin: Positive for color change, rash and wound. Negative for pallor.  Allergic/Immunologic: Positive for environmental allergies. Negative for food allergies.  Neurological: Positive for dizziness. Negative for tremors, seizures, syncope, facial asymmetry, speech difficulty, weakness, light-headedness, numbness and headaches.  Hematological: Negative for adenopathy. Does not bruise/bleed easily.  Psychiatric/Behavioral: Negative for sleep disturbance.  All other systems reviewed and are negative.   Allergies  Review of patient's allergies indicates no known allergies.  Home Medications   Prior to Admission medications   Medication Sig Start Date End Date Taking? Authorizing Provider  SYNTHROID 112 MCG tablet Take 1 tablet (112 mcg total) by mouth daily before breakfast. 01/26/16  Yes Jerrol Banana., MD  mupirocin ointment (BACTROBAN) 2 % Apply 1 application topically 2 (two) times daily. 05/18/16   Olen Cordial, NP  sulfamethoxazole-trimethoprim (BACTRIM DS,SEPTRA DS) 800-160 MG tablet Take 1 tablet by mouth 2 (two) times daily. 05/18/16   Olen Cordial, NP   Meds Ordered and Administered this Visit  Medications - No data to display  BP (!) 118/55 (BP Location: Right Arm)   Pulse (!) 53   Temp 97.6 F (36.4 C) (Oral)   Resp 18   Ht 5\' 5"  (1.651 m)   Wt 210 lb (95.3 kg)   SpO2 100%   BMI 34.95 kg/m  No data found.   Physical Exam  Constitutional: She is oriented to person, place, and time. Vital signs are normal. She appears well-developed and well-nourished. She is cooperative.  Non-toxic appearance. She does not have a sickly appearance. She does not appear ill. No distress.  HENT:  Head: Normocephalic and atraumatic.  Right Ear: Hearing and external  ear normal.  Left Ear: Hearing and external ear normal.  Nose: Nose normal.  Mouth/Throat: Uvula is midline, oropharynx is clear and moist and mucous membranes are normal.  Eyes: Conjunctivae, EOM and lids are normal. Pupils are equal, round, and reactive to light. Right eye exhibits no discharge. Left eye exhibits no discharge. Right conjunctiva is not injected. Right conjunctiva has no hemorrhage. Left conjunctiva is not injected. Left conjunctiva has no hemorrhage. No scleral icterus. Right eye exhibits normal extraocular motion and no nystagmus. Left eye exhibits normal extraocular motion and no nystagmus.  Neck: Trachea normal, normal range of motion and phonation normal. Neck supple. No tracheal deviation present. No thyromegaly present.  Cardiovascular: Normal rate, regular rhythm and normal heart sounds.   No murmur heard. Pulmonary/Chest: Effort normal and breath sounds normal. No stridor. No respiratory distress. She has no decreased breath sounds. She has no wheezes. She has no rhonchi. She has no rales. She exhibits no tenderness, no crepitus, no edema, no swelling and no retraction. Right breast exhibits skin change and tenderness. Right breast exhibits no inverted nipple, no mass and no nipple discharge. Breasts are symmetrical. There is breast swelling.    Abdominal: Soft. There is no tenderness.  Genitourinary: There is breast tenderness and discharge. No breast bleeding.  Musculoskeletal: Normal range of motion. She exhibits no edema, tenderness or deformity.       Right shoulder: Normal.       Left shoulder: Normal.       Right elbow: Normal.      Left elbow: Normal.       Right hip: Normal.       Left hip: Normal.       Right knee: Normal.       Left knee: Normal.       Right ankle: Normal.       Left ankle: Normal.       Cervical back: Normal.       Thoracic back: Normal.       Lumbar back: Normal.       Right hand: Normal.       Left hand: Normal.  Lymphadenopathy:         Head (right side): No submental, no submandibular, no tonsillar, no preauricular, no posterior auricular and no occipital adenopathy present.       Head (left side): No submental, no submandibular, no tonsillar, no preauricular, no posterior auricular and no occipital adenopathy present.    She has no cervical adenopathy.       Right cervical: No superficial cervical, no deep cervical and no posterior cervical adenopathy present.      Left cervical: No superficial cervical, no deep cervical and no posterior cervical adenopathy present.  Neurological: She is alert and oriented to person, place, and time. She has normal strength. She displays no atrophy and no tremor. No cranial nerve deficit or sensory deficit. She exhibits normal muscle tone. She displays no seizure activity. Coordination and gait normal. GCS eye subscore is 4. GCS verbal subscore is 5. GCS motor subscore is 6.  Skin: Skin is warm and dry. Capillary refill takes less than 2 seconds. Rash noted. No abrasion, no bruising, no  burn, no ecchymosis, no laceration, no lesion, no petechiae and no purpura noted. Rash is macular, papular, maculopapular and pustular. Rash is not nodular, not vesicular and not urticarial. She is not diaphoretic. There is erythema. No cyanosis. No pallor. Nails show no clubbing.     Psychiatric: She has a normal mood and affect. Her speech is normal and behavior is normal. Judgment and thought content normal. She is not actively hallucinating. Cognition and memory are normal. She is attentive.  Nursing note and vitals reviewed.   Urgent Care Course   Clinical Course    Procedures (including critical care time)  Labs Review Labs Reviewed - No data to display  Imaging Review No results found.      MDM   1. Ulcer of skin of breast   2. Cellulitis of right breast    Apply bactroban 2% ointment BID wash with soapy water BID until area healed.  Cover with gauze or bandaid and change BID and  prn soiling.  Follow up with Dr Rosanna Randy in 72 hours for wound recheck sooner if fever, spreading erythema/ulcer after 48 hours antibiotics.  Sooner if worsening pain/swelling/erythema/enlargement ulcerated area. Bactrim DS po BID x 7 days.  Do not scratch area.  Exitcare handout on skin infection given to patient.  RTC if worsening erythema, pain, purulent discharge, fever.  Wash towels, washcloths, sheets in hot water with bleach every couple of days until infection resolved.  Patient verbalized understanding, agreed with plan of care and had no further questions at this time.      Olen Cordial, NP 05/18/16 1506

## 2016-05-22 ENCOUNTER — Ambulatory Visit (INDEPENDENT_AMBULATORY_CARE_PROVIDER_SITE_OTHER): Payer: 59 | Admitting: Family Medicine

## 2016-05-22 ENCOUNTER — Encounter: Payer: Self-pay | Admitting: Family Medicine

## 2016-05-22 ENCOUNTER — Telehealth: Payer: Self-pay

## 2016-05-22 VITALS — BP 130/64 | HR 72 | Temp 97.8°F | Resp 16 | Ht 64.5 in | Wt 208.0 lb

## 2016-05-22 DIAGNOSIS — Z6835 Body mass index (BMI) 35.0-35.9, adult: Secondary | ICD-10-CM | POA: Diagnosis not present

## 2016-05-22 DIAGNOSIS — E6609 Other obesity due to excess calories: Secondary | ICD-10-CM

## 2016-05-22 DIAGNOSIS — G35 Multiple sclerosis: Secondary | ICD-10-CM | POA: Diagnosis not present

## 2016-05-22 NOTE — Telephone Encounter (Signed)
Courtesy call back completed today for patient's recent visit at [facility]. Patient did not answer, left message on machine to call back with any questions or concerns.   

## 2016-05-22 NOTE — Progress Notes (Signed)
       Patient: Erica Noble Female    DOB: 1961/07/14   55 y.o.   MRN: [id number] Visit Date: 05/23/2016  Today's Provider: Wilhemena Durie, MD   No chief complaint on file.  Subjective:    Patient is here with appeal form from [place] for BMI that needs to be signed.  Overall she is doing well and has lost 23 pounds over the past year with dietary changes. She is having more difficulty with walking and must use a walker or rollator or have help from her husband due to her MS.  No Known Allergies   Current Outpatient Prescriptions:  .  mupirocin ointment (BACTROBAN) 2 %, Apply 1 application topically 2 (two) times daily., Disp: 22 g, Rfl: 0 .  SYNTHROID 112 MCG tablet, Take 1 tablet (112 mcg total) by mouth daily before breakfast., Disp: 90 tablet, Rfl: 3  Review of Systems  Constitutional: Negative for appetite change, chills, fatigue and fever.  Respiratory: Negative for chest tightness and shortness of breath.   Cardiovascular: Negative for chest pain and palpitations.  Gastrointestinal: Negative for abdominal pain, nausea and vomiting.  Endocrine: Negative.   Allergic/Immunologic: Negative.   Neurological: Negative for dizziness and weakness.  Psychiatric/Behavioral: Negative.     Social History  Substance Use Topics  . Smoking status: Former Smoker    Quit date: 07/29/1986  . Smokeless tobacco: Never Used  . Alcohol use No   Objective:   BP 130/64 (BP Location: Left Arm, Patient Position: Sitting, Cuff Size: Large)   Pulse 72   Temp 97.8 F (36.6 C) (Oral)   Resp 16   Ht 5' 4.5" (1.638 m)   Wt 208 lb (94.3 kg)   BMI 35.15 kg/m   Physical Exam  Constitutional: She appears well-developed and well-nourished.  HENT:  Head: Normocephalic and atraumatic.  Neck: No thyromegaly present.  Cardiovascular: Normal rate, regular rhythm and normal heart sounds.   Pulmonary/Chest: Effort normal and breath sounds normal.  Abdominal: Soft.  Lymphadenopathy:   She has no cervical adenopathy.  Skin: Skin is warm and dry.  Psychiatric: She has a normal mood and affect. Her behavior is normal. Judgment and thought content normal.        Assessment & Plan:     Mild obesity Form filled out for work. She has actually lost 10% of her body weight of the asked from last year. Encouraged her to continue with dietary changes with a goal of losing 10 -12pounds over the next year. Multiple sclerosis Progressive Status post partial thyroidectomy for thyroiditis  I have done the exam and reviewed the chart and it is accurate to the best of my knowledge. Miguel Aschoff M.D. [place], MD  [facility]

## 2016-05-22 NOTE — Patient Instructions (Signed)
Patient is here with appeal form from [place] for BMI that needs to be signed.

## 2016-11-07 LAB — HM COLONOSCOPY

## 2016-12-11 ENCOUNTER — Encounter: Payer: Self-pay | Admitting: Family Medicine

## 2016-12-23 ENCOUNTER — Other Ambulatory Visit: Payer: Self-pay | Admitting: Family Medicine

## 2017-01-13 ENCOUNTER — Ambulatory Visit: Payer: 59 | Admitting: Family Medicine

## 2017-01-14 ENCOUNTER — Ambulatory Visit: Payer: 59 | Admitting: Family Medicine

## 2017-01-17 ENCOUNTER — Other Ambulatory Visit: Payer: Self-pay | Admitting: Family Medicine

## 2017-03-21 LAB — CBC AND DIFFERENTIAL
HCT: 40 (ref 36–46)
Hemoglobin: 13.6 (ref 12.0–16.0)
PLATELETS: 206 (ref 150–399)
WBC: 6.6

## 2017-03-21 LAB — VITAMIN B12: Vitamin B-12: 299

## 2017-03-21 LAB — VITAMIN D 25 HYDROXY (VIT D DEFICIENCY, FRACTURES): Vit D, 25-Hydroxy: 36.6

## 2017-03-26 ENCOUNTER — Other Ambulatory Visit: Payer: Self-pay | Admitting: Neurology

## 2017-03-26 DIAGNOSIS — R2689 Other abnormalities of gait and mobility: Secondary | ICD-10-CM

## 2017-03-26 DIAGNOSIS — G35 Multiple sclerosis: Secondary | ICD-10-CM

## 2017-03-26 DIAGNOSIS — R42 Dizziness and giddiness: Secondary | ICD-10-CM

## 2017-04-02 ENCOUNTER — Ambulatory Visit: Payer: 59

## 2017-04-02 ENCOUNTER — Other Ambulatory Visit: Payer: 59

## 2017-04-24 ENCOUNTER — Ambulatory Visit (INDEPENDENT_AMBULATORY_CARE_PROVIDER_SITE_OTHER): Payer: 59 | Admitting: Family Medicine

## 2017-04-24 VITALS — BP 132/70 | HR 58 | Resp 16 | Ht 64.0 in | Wt 202.0 lb

## 2017-04-24 DIAGNOSIS — R5383 Other fatigue: Secondary | ICD-10-CM

## 2017-04-24 DIAGNOSIS — E039 Hypothyroidism, unspecified: Secondary | ICD-10-CM

## 2017-04-24 DIAGNOSIS — R5381 Other malaise: Secondary | ICD-10-CM | POA: Diagnosis not present

## 2017-04-24 NOTE — Progress Notes (Signed)
Erica Noble  MRN: [id number] DOB: [birth year]  Subjective:  HPI   Patient is a 56 year old female who presents for what was thought to be a physical.  However the patient and her husband have declined a physical and state they have a form from his work that requires a physician signature for her biometrics.  She will need a plan documented for her to reduce her BMI and an explanation why she had not met the goal this year.    The patient has Multiple Sclerosis and finds it more difficult to exercise.  Her BMI has gone from 35.15 to 34.67.    Patient Active Problem List   Diagnosis Date Noted  . Polypharmacy 03/17/2015  . History of colon polyps 03/17/2015  . HLD (hyperlipidemia) 03/17/2015  . Adult hypothyroidism 03/17/2015  . Malaise and fatigue 03/17/2015  . DS (disseminated sclerosis) ([place]) 03/17/2015  . Post menopausal syndrome 03/17/2015  . Herpes zona 03/17/2015  . Disease of thyroid gland 03/17/2015  . Avitaminosis D 03/17/2015  . Back ache 01/30/2010  . Adiposity 01/30/2010  . Acquired hypothyroidism 05/26/2009  . Allergic rhinitis 05/26/2009    Past Medical History:  Diagnosis Date  . Allergy 05/26/2009   Allergic rhinitis  . Hyperlipidemia   . Multiple sclerosis ([place])    2008 diagnosis    Social History   Social History  . Marital status: Married    Spouse name: N/A  . Number of children: N/A  . Years of education: N/A   Occupational History  . Not on file.   Social History Main Topics  . Smoking status: Former Smoker    Quit date: 07/29/1986  . Smokeless tobacco: Never Used  . Alcohol use No  . Drug use: No  . Sexual activity: Not on file   Other Topics Concern  . Not on file   Social History Narrative  . No narrative on file    Outpatient Encounter Prescriptions as of 04/24/2017  Medication Sig  . SYNTHROID 112 MCG tablet TAKE 1 TABLET BY MOUTH  DAILY BEFORE BREAKFAST  . [DISCONTINUED] mupirocin ointment (BACTROBAN) 2 % Apply 1  application topically 2 (two) times daily.   No facility-administered encounter medications on file as of 04/24/2017.     No Known Allergies  Review of Systems  Constitutional: Positive for malaise/fatigue (chronic from MS, no changes). Negative for fever.  Respiratory: Negative for cough, shortness of breath and wheezing.   Cardiovascular: Negative for chest pain and palpitations.  Neurological: Positive for dizziness (chronic from MS, no changes). Negative for weakness.    Objective:  BP 132/70 (BP Location: Right Arm, Patient Position: Sitting, Cuff Size: Normal)   Pulse (!) 58   Resp 16   Ht 5' 4"  (1.626 m)   Wt 202 lb (91.6 kg)   BMI 34.67 kg/m   Physical Exam  Constitutional: She is oriented to person, place, and time and well-developed, well-nourished, and in no distress.  HENT:  Head: Normocephalic and atraumatic.  Eyes: Pupils are equal, round, and reactive to light.  Neck: Normal range of motion.  Cardiovascular: Normal rate, regular rhythm and normal heart sounds.   Pulmonary/Chest: Effort normal and breath sounds normal.  Neurological: She is alert and oriented to person, place, and time. GCS score is 15.  Skin: Skin is warm and dry.  Psychiatric: Mood, memory, affect and judgment normal.    Assessment and Plan :   1. Adult hypothyroidism  - TSH  2. Malaise and  fatigue  - Comprehensive metabolic panel  3.Multiple Sclerosis 4.Mild Obesity  I have done the exam and reviewed the chart and it is accurate to the best of my knowledge. Development worker, community has been used and  any errors in dictation or transcription are unintentional. Miguel Aschoff M.D. [facility]

## 2017-04-25 LAB — COMPREHENSIVE METABOLIC PANEL
ALBUMIN: 4.5 g/dL (ref 3.5–5.5)
ALT: 17 IU/L (ref 0–32)
AST: 16 IU/L (ref 0–40)
Albumin/Globulin Ratio: 1.9 (ref 1.2–2.2)
Alkaline Phosphatase: 64 IU/L (ref 39–117)
BUN / CREAT RATIO: 13 (ref 9–23)
BUN: 10 mg/dL (ref 6–24)
Bilirubin Total: 0.5 mg/dL (ref 0.0–1.2)
CALCIUM: 9.3 mg/dL (ref 8.7–10.2)
CO2: 26 mmol/L (ref 20–29)
CREATININE: 0.79 mg/dL (ref 0.57–1.00)
Chloride: 103 mmol/L (ref 96–106)
GFR calc Af Amer: 97 mL/min/{1.73_m2} (ref >59)
GFR, EST NON AFRICAN AMERICAN: 85 mL/min/{1.73_m2} (ref >59)
GLOBULIN, TOTAL: 2.4 g/dL (ref 1.5–4.5)
Glucose: 94 mg/dL (ref 65–99)
Potassium: 4.1 mmol/L (ref 3.5–5.2)
SODIUM: 142 mmol/L (ref 134–144)
Total Protein: 6.9 g/dL (ref 6.0–8.5)

## 2017-04-25 LAB — TSH: TSH: 2.92 u[IU]/mL (ref 0.450–4.500)

## 2017-04-28 ENCOUNTER — Telehealth: Payer: Self-pay

## 2017-04-28 NOTE — Telephone Encounter (Signed)
[id number] 

## 2017-04-28 NOTE — Telephone Encounter (Signed)
-----   Message from Jerrol Banana., MD sent at 04/25/2017 12:57 PM EDT ----- Labs okay

## 2017-04-28 NOTE — Telephone Encounter (Signed)
Advised patient of results.  

## 2017-08-20 ENCOUNTER — Encounter: Payer: Self-pay | Admitting: Family Medicine

## 2017-08-20 LAB — HM PAP SMEAR: HM Pap smear: NEGATIVE

## 2017-08-28 ENCOUNTER — Other Ambulatory Visit: Payer: Self-pay | Admitting: Obstetrics and Gynecology

## 2017-08-28 DIAGNOSIS — Z1231 Encounter for screening mammogram for malignant neoplasm of breast: Secondary | ICD-10-CM

## 2017-09-17 ENCOUNTER — Ambulatory Visit
Admission: RE | Admit: 2017-09-17 | Discharge: 2017-09-17 | Disposition: A | Discharge status: Home / Self Care | Payer: Managed Care, Other (non HMO) | Source: Ambulatory Visit | Attending: Obstetrics and Gynecology | Admitting: Obstetrics and Gynecology

## 2017-09-17 DIAGNOSIS — Z1231 Encounter for screening mammogram for malignant neoplasm of breast: Secondary | ICD-10-CM | POA: Insufficient documentation

## 2017-09-17 HISTORY — DX: Malignant (primary) neoplasm, unspecified: C80.1

## 2017-10-13 ENCOUNTER — Telehealth: Payer: Self-pay

## 2017-10-13 MED ORDER — PROMETHAZINE HCL 25 MG PO TABS: 25.0000 mg | ORAL_TABLET | Freq: Four times a day (QID) | ORAL | 0 refills | Status: DC | PRN

## 2017-10-13 NOTE — Telephone Encounter (Signed)
Phenergan 25mg  q 6 hrs prn nausea. #25

## 2017-10-13 NOTE — Telephone Encounter (Signed)
Patient's husband is calling saying that the patient has had a really bad stomach virus for the last 2 days. She has nausea, vomiting, and diarrhea. She also has a headache that worsened last night. He is requesting that something be sent into the pharmacy to help with her symptoms. She uses [facility] in [place]. Thanks!

## 2017-10-13 NOTE — Telephone Encounter (Signed)
Sent in medication as below.  

## 2017-12-25 ENCOUNTER — Encounter: Payer: Self-pay | Admitting: Family Medicine

## 2018-01-05 ENCOUNTER — Other Ambulatory Visit: Payer: Self-pay | Admitting: Family Medicine

## 2018-04-09 ENCOUNTER — Ambulatory Visit: Payer: Managed Care, Other (non HMO) | Admitting: Family Medicine

## 2018-04-09 VITALS — BP 124/74 | HR 60 | Temp 97.9°F | Resp 16 | Ht 65.0 in | Wt 205.0 lb

## 2018-04-09 DIAGNOSIS — Z6835 Body mass index (BMI) 35.0-35.9, adult: Secondary | ICD-10-CM | POA: Diagnosis not present

## 2018-04-09 DIAGNOSIS — G35 Multiple sclerosis: Secondary | ICD-10-CM

## 2018-04-09 DIAGNOSIS — E6609 Other obesity due to excess calories: Secondary | ICD-10-CM

## 2018-04-09 DIAGNOSIS — E039 Hypothyroidism, unspecified: Secondary | ICD-10-CM

## 2018-04-09 NOTE — Progress Notes (Signed)
Erica Noble  MRN: [id number] DOB: Jun 17, 1961  Subjective:  HPI   The patient is a 57 year old female who presents for paperwork to be filled out for her insurance company.  The patient did not pass for the BMI portion of the test. She has progressive MS and has trouble moving.   Patient Active Problem List   Diagnosis Date Noted  . Polypharmacy 03/17/2015  . History of colon polyps 03/17/2015  . HLD (hyperlipidemia) 03/17/2015  . Adult hypothyroidism 03/17/2015  . Malaise and fatigue 03/17/2015  . DS (disseminated sclerosis) ([place]) 03/17/2015  . Post menopausal syndrome 03/17/2015  . Herpes zona 03/17/2015  . Disease of thyroid gland 03/17/2015  . Avitaminosis D 03/17/2015  . Back ache 01/30/2010  . Adiposity 01/30/2010  . Acquired hypothyroidism 05/26/2009  . Allergic rhinitis 05/26/2009    Past Medical History:  Diagnosis Date  . Allergy 05/26/2009   Allergic rhinitis  . Cancer ([place])    skin ca  . Hyperlipidemia   . Multiple sclerosis ([place])    2008 diagnosis    Social History   Socioeconomic History  . Marital status: Married    Spouse name: Not on file  . Number of children: Not on file  . Years of education: Not on file  . Highest education level: Not on file  Occupational History  . Not on file  Social Needs  . Financial resource strain: Not on file  . Food insecurity:    Worry: Not on file    Inability: Not on file  . Transportation needs:    Medical: Not on file    Non-medical: Not on file  Tobacco Use  . Smoking status: Former Smoker    Last attempt to quit: 07/29/1986    Years since quitting: 31.7  . Smokeless tobacco: Never Used  Substance and Sexual Activity  . Alcohol use: No  . Drug use: No  . Sexual activity: Not on file  Lifestyle  . Physical activity:    Days per week: Not on file    Minutes per session: Not on file  . Stress: Not on file  Relationships  . Social connections:    Talks on phone: Not on file    Gets together:  Not on file    Attends religious service: Not on file    Active member of club or organization: Not on file    Attends meetings of clubs or organizations: Not on file    Relationship status: Not on file  . Intimate partner violence:    Fear of current or ex partner: Not on file    Emotionally abused: Not on file    Physically abused: Not on file    Forced sexual activity: Not on file  Other Topics Concern  . Not on file  Social History Narrative  . Not on file    Outpatient Encounter Medications as of 04/09/2018  Medication Sig  . SYNTHROID 112 MCG tablet TAKE 1 TABLET BY MOUTH  DAILY BEFORE BREAKFAST  . [DISCONTINUED] promethazine (PHENERGAN) 25 MG tablet Take 1 tablet (25 mg total) by mouth every 6 (six) hours as needed for nausea or vomiting.   No facility-administered encounter medications on file as of 04/09/2018.     No Known Allergies  Review of Systems  Constitutional: Positive for malaise/fatigue. Negative for fever.  Eyes: Negative.   Respiratory: Negative for shortness of breath.   Cardiovascular: Positive for leg swelling (ankles). Negative for chest pain and palpitations.  Gastrointestinal: Negative.   Skin: Negative.   Neurological: Positive for weakness.  Endo/Heme/Allergies: Negative.   Psychiatric/Behavioral: Negative.     Objective:  BP 124/74 (BP Location: Right Arm, Patient Position: Sitting, Cuff Size: Normal)   Pulse 60   Temp 97.9 F (36.6 C) (Oral)   Resp 16   Wt 205 lb (93 kg)   BMI 35.19 kg/m   Physical Exam  Constitutional: She is oriented to person, place, and time and well-developed, well-nourished, and in no distress.  Obese WF NAD.  HENT:  Head: Normocephalic and atraumatic.  Eyes: Conjunctivae are normal. No scleral icterus.  Neck: No thyromegaly present.  Cardiovascular: Normal rate, regular rhythm and normal heart sounds.  Pulmonary/Chest: Effort normal and breath sounds normal.  Abdominal: Soft.  Musculoskeletal: She exhibits  no edema.  Lymphadenopathy:    She has no cervical adenopathy.  Neurological: She is alert and oriented to person, place, and time. Gait normal. GCS score is 15.  Skin: Skin is warm and dry.  Psychiatric: Mood, memory, affect and judgment normal.    Assessment and Plan :   1. Adult hypothyroidism  - TSH  2. DS (disseminated sclerosis) (HCC)/Multiple Sclerosis Per Neurology  3. Class 2 obesity due to excess calories without serious comorbidity with body mass index (BMI) of 35.0 to 35.9 in adult Will work on smaller pportion as unable to exercise due to [place], RTC early 2020.  I have done the exam and reviewed the chart and it is accurate to the best of my knowledge. Development worker, community has been used and  any errors in dictation or transcription are unintentional. Miguel Aschoff M.D. [facility]

## 2018-04-10 LAB — TSH: TSH: 1.14 u[IU]/mL (ref 0.450–4.500)

## 2018-04-13 ENCOUNTER — Telehealth: Payer: Self-pay

## 2018-04-13 NOTE — Telephone Encounter (Signed)
-----   Message from Jerrol Banana., MD sent at 04/13/2018  8:56 AM EDT ----- Thyroid in normal range.

## 2018-04-13 NOTE — Telephone Encounter (Signed)
[facility]  ED   ----- Message from Jerrol Banana., MD sent at 04/13/2018  8:56 AM EDT ----- Thyroid in normal range.

## 2018-04-14 ENCOUNTER — Telehealth: Payer: Self-pay | Admitting: Family Medicine

## 2018-04-14 NOTE — Telephone Encounter (Signed)
Pt returned call ° °Thanks  teri °

## 2018-04-14 NOTE — Telephone Encounter (Signed)
Advised  ED 

## 2018-06-08 ENCOUNTER — Other Ambulatory Visit: Payer: Self-pay | Admitting: Family Medicine

## 2018-07-20 ENCOUNTER — Ambulatory Visit: Payer: Managed Care, Other (non HMO) | Admitting: Family Medicine

## 2018-08-12 ENCOUNTER — Encounter: Payer: Self-pay | Admitting: Family Medicine

## 2018-08-12 ENCOUNTER — Ambulatory Visit (INDEPENDENT_AMBULATORY_CARE_PROVIDER_SITE_OTHER): Payer: Managed Care, Other (non HMO) | Admitting: Family Medicine

## 2018-08-12 VITALS — BP 116/76 | HR 57 | Temp 97.6°F | Wt 214.4 lb

## 2018-08-12 DIAGNOSIS — Z0000 Encounter for general adult medical examination without abnormal findings: Secondary | ICD-10-CM

## 2018-08-12 DIAGNOSIS — Z6835 Body mass index (BMI) 35.0-35.9, adult: Secondary | ICD-10-CM

## 2018-08-12 DIAGNOSIS — G35 Multiple sclerosis: Secondary | ICD-10-CM | POA: Diagnosis not present

## 2018-08-12 NOTE — Progress Notes (Signed)
Patient: Erica Noble, Female    DOB: 12-25-1960, 57 y.o.   MRN: [id number] Visit Date: 08/12/2018  Today's Provider: Wilhemena Durie, MD   Chief Complaint  Patient presents with  . Annual Exam   Subjective:    Annual physical exam Erica Noble is a 58 y.o. female who presents today for health maintenance and complete physical. She feels well. She reports exercising none. She reports she is sleeping well.  -----------------------------------------------------------------   Review of Systems  Constitutional: Positive for fatigue.  HENT: Negative.   Eyes: Negative.   Respiratory: Negative.   Cardiovascular: Negative.   Gastrointestinal: Positive for constipation.  Endocrine: Positive for polyuria.  Genitourinary: Positive for urgency.  Musculoskeletal: Negative.   Skin: Negative.   Allergic/Immunologic: Negative.   Neurological: Positive for dizziness.  Hematological: Negative.   Psychiatric/Behavioral: Negative.     Social History She  reports that she quit smoking about 32 years ago. She has never used smokeless tobacco. She reports that she does not drink alcohol or use drugs. Social History   Socioeconomic History  . Marital status: Married    Spouse name: Not on file  . Number of children: Not on file  . Years of education: Not on file  . Highest education level: Not on file  Occupational History  . Not on file  Social Needs  . Financial resource strain: Not on file  . Food insecurity:    Worry: Not on file    Inability: Not on file  . Transportation needs:    Medical: Not on file    Non-medical: Not on file  Tobacco Use  . Smoking status: Former Smoker    Last attempt to quit: 07/29/1986    Years since quitting: 32.0  . Smokeless tobacco: Never Used  Substance and Sexual Activity  . Alcohol use: No  . Drug use: No  . Sexual activity: Not on file  Lifestyle  . Physical activity:    Days per week: Not on file    Minutes per session:  Not on file  . Stress: Not on file  Relationships  . Social connections:    Talks on phone: Not on file    Gets together: Not on file    Attends religious service: Not on file    Active member of club or organization: Not on file    Attends meetings of clubs or organizations: Not on file    Relationship status: Not on file  Other Topics Concern  . Not on file  Social History Narrative  . Not on file    Patient Active Problem List   Diagnosis Date Noted  . Polypharmacy 03/17/2015  . History of colon polyps 03/17/2015  . HLD (hyperlipidemia) 03/17/2015  . Adult hypothyroidism 03/17/2015  . Malaise and fatigue 03/17/2015  . DS (disseminated sclerosis) ([place]) 03/17/2015  . Post menopausal syndrome 03/17/2015  . Herpes zona 03/17/2015  . Disease of thyroid gland 03/17/2015  . Avitaminosis D 03/17/2015  . Back ache 01/30/2010  . Adiposity 01/30/2010  . Acquired hypothyroidism 05/26/2009  . Allergic rhinitis 05/26/2009    Past Surgical History:  Procedure Laterality Date  . BREAST BIOPSY Right 2014   bx/clip- neg  . CHOLECYSTECTOMY  2000  . history of thyroidectomy  2008    Family History  No family status information on file.   Her She was adopted. Family history is unknown by patient.     No Known Allergies  Previous Medications  SYNTHROID 112 MCG TABLET    TAKE 1 TABLET BY MOUTH  DAILY BEFORE BREAKFAST    Patient Care Team: Jerrol Banana., MD as PCP - General (Family Medicine)      Objective:   Vitals: BP 116/76 (BP Location: Left Arm, Patient Position: Sitting, Cuff Size: Normal)   Pulse (!) 57   Temp 97.6 F (36.4 C) (Oral)   Wt 214 lb 6.4 oz (97.3 kg)   SpO2 97%   BMI 35.68 kg/m    Physical Exam Constitutional:      Appearance: Normal appearance. She is obese.  HENT:     Head: Normocephalic and atraumatic.     Right Ear: Tympanic membrane, ear canal and external ear normal.     Left Ear: Tympanic membrane, ear canal and external ear  normal.     Nose: Nose normal.     Mouth/Throat:     Pharynx: Oropharynx is clear.  Eyes:     General: No scleral icterus. Cardiovascular:     Rate and Rhythm: Normal rate and regular rhythm.     Pulses: Normal pulses.     Heart sounds: Normal heart sounds.  Pulmonary:     Effort: Pulmonary effort is normal.     Breath sounds: Normal breath sounds.  Abdominal:     Palpations: Abdomen is soft.  Musculoskeletal:        General: No swelling.     Right lower leg: No edema.     Left lower leg: No edema.  Lymphadenopathy:     Cervical: No cervical adenopathy.  Skin:    General: Skin is warm and dry.  Neurological:     Mental Status: She is alert and oriented to person, place, and time. Mental status is at baseline.     Coordination: Coordination abnormal.     Comments: Progressive weakness from MS.  Psychiatric:        Mood and Affect: Mood normal.        Behavior: Behavior normal.        Thought Content: Thought content normal.        Judgment: Judgment normal.      Depression Screen PHQ 2/9 Scores 08/12/2018 04/24/2017  PHQ - 2 Score 2 0  PHQ- 9 Score 8 7      Assessment & Plan:     Routine Health Maintenance and Physical Exam  Exercise Activities and Dietary recommendations Goals   None     Immunization History  Administered Date(s) Administered  . Tdap 12/30/2012    Health Maintenance  Topic Date Due  . HIV Screening  05/26/1976  . INFLUENZA VACCINE  02/26/2018  . MAMMOGRAM  09/18/2019  . COLONOSCOPY  11/07/2021  . PAP SMEAR-Modifier  08/20/2022  . TETANUS/TDAP  12/31/2022  . Hepatitis C Screening  Completed     Discussed health benefits of physical activity, and encouraged her to engage in regular exercise appropriate for her age and condition.  1. Annual physical exam  - CBC with Differential/Platelet - Comprehensive metabolic panel - Lipid panel - TSH  2. DS (disseminated sclerosis) (HCC)   3. Class 2 severe obesity due to excess  calories with serious comorbidity and body mass index (BMI) of 35.0 to 35.9 in adult [facility]) Dietary changes to help with weight loss are stressed.  Whatever exercise she can do will be helpful to her also.    -------------------------------------------------------------------- I have done the exam and reviewed the chart and it is accurate to the best of  my knowledge. Development worker, community has been used and  any errors in dictation or transcription are unintentional. Miguel Aschoff M.D. [facility]

## 2018-08-13 LAB — CBC WITH DIFFERENTIAL/PLATELET
BASOS: 1 %
Basophils Absolute: 0.1 10*3/uL (ref 0.0–0.2)
EOS (ABSOLUTE): 0.1 10*3/uL (ref 0.0–0.4)
Eos: 1 %
Hematocrit: 40.2 % (ref 34.0–46.6)
Hemoglobin: 13.6 g/dL (ref 11.1–15.9)
Immature Grans (Abs): 0 10*3/uL (ref 0.0–0.1)
Immature Granulocytes: 0 %
LYMPHS ABS: 2.5 10*3/uL (ref 0.7–3.1)
Lymphs: 36 %
MCH: 32 pg (ref 26.6–33.0)
MCHC: 33.8 g/dL (ref 31.5–35.7)
MCV: 95 fL (ref 79–97)
MONOS ABS: 0.6 10*3/uL (ref 0.1–0.9)
Monocytes: 8 %
NEUTROS ABS: 3.7 10*3/uL (ref 1.4–7.0)
Neutrophils: 54 %
Platelets: 211 10*3/uL (ref 150–450)
RBC: 4.25 x10E6/uL (ref 3.77–5.28)
RDW: 12.4 % (ref 11.7–15.4)
WBC: 7 10*3/uL (ref 3.4–10.8)

## 2018-08-13 LAB — COMPREHENSIVE METABOLIC PANEL
ALT: 15 IU/L (ref 0–32)
AST: 14 IU/L (ref 0–40)
Albumin/Globulin Ratio: 2.2 (ref 1.2–2.2)
Albumin: 4.6 g/dL (ref 3.5–5.5)
Alkaline Phosphatase: 63 IU/L (ref 39–117)
BILIRUBIN TOTAL: 0.3 mg/dL (ref 0.0–1.2)
BUN/Creatinine Ratio: 16 (ref 9–23)
BUN: 11 mg/dL (ref 6–24)
CO2: 24 mmol/L (ref 20–29)
Calcium: 9.3 mg/dL (ref 8.7–10.2)
Chloride: 102 mmol/L (ref 96–106)
Creatinine, Ser: 0.7 mg/dL (ref 0.57–1.00)
GFR calc non Af Amer: 96 mL/min/{1.73_m2} (ref >59)
GFR, EST AFRICAN AMERICAN: 111 mL/min/{1.73_m2} (ref >59)
Globulin, Total: 2.1 g/dL (ref 1.5–4.5)
Glucose: 97 mg/dL (ref 65–99)
Potassium: 4.2 mmol/L (ref 3.5–5.2)
Sodium: 141 mmol/L (ref 134–144)
Total Protein: 6.7 g/dL (ref 6.0–8.5)

## 2018-08-13 LAB — TSH: TSH: 4.67 u[IU]/mL — ABNORMAL HIGH (ref 0.450–4.500)

## 2018-08-13 LAB — LIPID PANEL
Chol/HDL Ratio: 2.6 ratio (ref 0.0–4.4)
Cholesterol, Total: 171 mg/dL (ref 100–199)
HDL: 66 mg/dL (ref >39)
LDL Calculated: 91 mg/dL (ref 0–99)
Triglycerides: 70 mg/dL (ref 0–149)
VLDL CHOLESTEROL CAL: 14 mg/dL (ref 5–40)

## 2018-08-24 ENCOUNTER — Telehealth: Payer: Self-pay

## 2018-08-24 NOTE — Telephone Encounter (Signed)
-----   Message from Jerrol Banana., MD sent at 08/20/2018  1:39 PM EST ----- Stable.  Recheck in 6 months instead of 12 for TSH

## 2018-08-24 NOTE — Telephone Encounter (Signed)
Patient has viewed results on mychart.  Patient advised as below.

## 2018-10-14 ENCOUNTER — Other Ambulatory Visit: Payer: Self-pay | Admitting: Obstetrics and Gynecology

## 2018-10-14 DIAGNOSIS — Z1231 Encounter for screening mammogram for malignant neoplasm of breast: Secondary | ICD-10-CM

## 2018-12-06 ENCOUNTER — Other Ambulatory Visit: Payer: Self-pay | Admitting: Family Medicine

## 2018-12-30 ENCOUNTER — Other Ambulatory Visit: Payer: Self-pay

## 2018-12-30 ENCOUNTER — Ambulatory Visit
Admission: RE | Admit: 2018-12-30 | Discharge: 2018-12-30 | Disposition: A | Discharge status: Home / Self Care | Payer: Managed Care, Other (non HMO) | Source: Ambulatory Visit | Attending: Obstetrics and Gynecology | Admitting: Obstetrics and Gynecology

## 2018-12-30 DIAGNOSIS — Z1231 Encounter for screening mammogram for malignant neoplasm of breast: Secondary | ICD-10-CM

## 2019-01-13 ENCOUNTER — Other Ambulatory Visit: Payer: Self-pay | Admitting: Family Medicine

## 2019-01-13 ENCOUNTER — Telehealth: Payer: Self-pay | Admitting: Family Medicine

## 2019-01-13 ENCOUNTER — Telehealth: Payer: Self-pay | Admitting: General Practice

## 2019-01-13 DIAGNOSIS — Z20822 Contact with and (suspected) exposure to covid-19: Secondary | ICD-10-CM

## 2019-01-13 NOTE — Telephone Encounter (Signed)
Please advise 

## 2019-01-13 NOTE — Addendum Note (Signed)
Addended by: Dimple Nanas on: 01/13/2019 04:41 PM   Modules accepted: Orders

## 2019-01-13 NOTE — Progress Notes (Signed)
Covid orderd for direct covid 19 exposure.

## 2019-01-13 NOTE — Telephone Encounter (Signed)
If she had direct exposure I would test her

## 2019-01-13 NOTE — Telephone Encounter (Signed)
Do you want to put in orders for her to be tested today?

## 2019-01-13 NOTE — Telephone Encounter (Signed)
Pt has had exposure to someone who tested positive.  Her hairdresser tested positive this week.  Pt has no symptoms .  Should she be tested or wait for symptoms.  CB#  [phone]  Thanks Con Memos

## 2019-01-13 NOTE — Telephone Encounter (Signed)
Pt has been scheduled for covid testing. Scheduled with pt directly. Pt was referred by: Jerrol Banana., MD

## 2019-01-13 NOTE — Telephone Encounter (Signed)
Drive through test this week.

## 2019-01-14 ENCOUNTER — Other Ambulatory Visit: Payer: Managed Care, Other (non HMO)

## 2019-01-14 DIAGNOSIS — Z20822 Contact with and (suspected) exposure to covid-19: Secondary | ICD-10-CM

## 2019-01-16 LAB — NOVEL CORONAVIRUS, NAA: SARS-CoV-2, NAA: NOT DETECTED

## 2019-01-25 ENCOUNTER — Other Ambulatory Visit: Payer: Self-pay

## 2019-01-25 ENCOUNTER — Encounter: Payer: Self-pay | Admitting: Family Medicine

## 2019-01-25 ENCOUNTER — Ambulatory Visit: Payer: Managed Care, Other (non HMO) | Admitting: Family Medicine

## 2019-01-25 VITALS — BP 126/82 | HR 65 | Temp 98.0°F | Resp 18 | Wt 221.0 lb

## 2019-01-25 DIAGNOSIS — F411 Generalized anxiety disorder: Secondary | ICD-10-CM | POA: Diagnosis not present

## 2019-01-25 DIAGNOSIS — E039 Hypothyroidism, unspecified: Secondary | ICD-10-CM

## 2019-01-25 DIAGNOSIS — Z6835 Body mass index (BMI) 35.0-35.9, adult: Secondary | ICD-10-CM

## 2019-01-25 DIAGNOSIS — G35 Multiple sclerosis: Secondary | ICD-10-CM

## 2019-01-25 NOTE — Progress Notes (Signed)
Patient: Erica Noble Female    DOB: 1960-10-09   58 y.o.   MRN: [id number] Visit Date: 01/25/2019  Today's Provider: Wilhemena Durie, MD   Chief Complaint  Patient presents with  . Follow-up   Subjective:    6 Month Follow Up for Thyroid. She feels emotionally beat up due to covid restrictions. She has gained weight as she is eating more lately. HPI  Depression screen [facility] 2/9 01/25/2019 01/25/2019 08/12/2018  Decreased Interest 0 0 1  Down, Depressed, Hopeless 2 0 1  PHQ - 2 Score 2 0 2  Altered sleeping 1 - 1  Tired, decreased energy 3 - 2  Change in appetite 1 - 1  Feeling bad or failure about yourself  3 - 0  Trouble concentrating 2 - 1  Moving slowly or fidgety/restless 2 - 1  Suicidal thoughts 0 - 0  PHQ-9 Score 14 - 8  Difficult doing work/chores Somewhat difficult - Not difficult at all   GAD 7 : Generalized Anxiety Score 01/25/2019 01/25/2019 01/25/2019  Nervous, Anxious, on Edge 1 2 1   Control/stop worrying 2 3 2   Worry too much - different things 2 1 2   Trouble relaxing 0 0 0  Restless 0 0 0  Easily annoyed or irritable 1 0 1  Afraid - awful might happen 0 1 0  Total GAD 7 Score 6 7 6   Anxiety Difficulty Not difficult at all Not difficult at all Not difficult at all    No Known Allergies   Current Outpatient Medications:  .  SYNTHROID 112 MCG tablet, TAKE 1 TABLET BY MOUTH  DAILY BEFORE BREAKFAST, Disp: 90 tablet, Rfl: 1  Review of Systems  Constitutional: Positive for fatigue.       Weight gain.  Eyes: Negative.   Respiratory: Negative.   Cardiovascular: Negative.   Gastrointestinal: Negative.   Endocrine: Negative.   Allergic/Immunologic: Negative.   Neurological: Positive for weakness.  Psychiatric/Behavioral: Positive for dysphoric mood. The patient is nervous/anxious.        Not suicidal.  All other systems reviewed and are negative.   Social History   Tobacco Use  . Smoking status: Former Smoker    Quit date: 07/29/1986   Years since quitting: 32.5  . Smokeless tobacco: Never Used  Substance Use Topics  . Alcohol use: No      Objective:   BP 126/82 (BP Location: Left Arm, Patient Position: Sitting, Cuff Size: Large)   Pulse 65   Temp 98 F (36.7 C) (Oral)   Resp 18   Wt 221 lb (100.2 kg)   SpO2 98%   BMI 36.78 kg/m  Vitals:   01/25/19 0923  BP: 126/82  Pulse: 65  Resp: 18  Temp: 98 F (36.7 C)  TempSrc: Oral  SpO2: 98%  Weight: 221 lb (100.2 kg)     Physical Exam Vitals signs reviewed.  Constitutional:      Appearance: Normal appearance. She is obese.  HENT:     Head: Normocephalic and atraumatic.     Right Ear: Tympanic membrane, ear canal and external ear normal.     Left Ear: Tympanic membrane, ear canal and external ear normal.     Nose: Nose normal.     Mouth/Throat:     Pharynx: Oropharynx is clear.  Eyes:     General: No scleral icterus. Cardiovascular:     Rate and Rhythm: Normal rate and regular rhythm.     Pulses: Normal pulses.  Heart sounds: Normal heart sounds.  Pulmonary:     Effort: Pulmonary effort is normal.     Breath sounds: Normal breath sounds.  Abdominal:     Palpations: Abdomen is soft.  Musculoskeletal:        General: No swelling.     Right lower leg: No edema.     Left lower leg: No edema.  Lymphadenopathy:     Cervical: No cervical adenopathy.  Skin:    General: Skin is warm and dry.  Neurological:     Mental Status: She is alert and oriented to person, place, and time. Mental status is at baseline.     Coordination: Coordination abnormal.     Comments: Progressive weakness from MS.  Psychiatric:        Mood and Affect: Mood normal.        Behavior: Behavior normal.        Thought Content: Thought content normal.        Judgment: Judgment normal.   PHQ9 is 14 GAD is 7   No results found for any visits on 01/25/19.     Assessment & Plan    1. Adult hypothyroidism  - TSH  2. DS (disseminated sclerosis) (HCC)   3. Multiple  sclerosis ([place]) Per Dr Manuella Ghazi.  4. Class 2 severe obesity due to excess calories with serious comorbidity and body mass index (BMI) of 35.0 to 35.9 in adult [facility]) Recommended weight watchers which has helped before.  5. GAD (generalized anxiety disorder) Consider sSRI.     Jakarri Lesko Cranford Mon, MD  [facility]

## 2019-01-26 ENCOUNTER — Telehealth: Payer: Self-pay

## 2019-01-26 DIAGNOSIS — G35 Multiple sclerosis: Secondary | ICD-10-CM | POA: Insufficient documentation

## 2019-01-26 LAB — TSH: TSH: 2.79 u[IU]/mL (ref 0.450–4.500)

## 2019-01-26 NOTE — Telephone Encounter (Signed)
-----   Message from Jerrol Banana., MD sent at 01/26/2019  7:41 AM EDT ----- Level correct.

## 2019-01-26 NOTE — Telephone Encounter (Signed)
Left message for patient to call regarding lab results.  

## 2019-01-27 ENCOUNTER — Other Ambulatory Visit: Payer: Self-pay

## 2019-01-27 ENCOUNTER — Telehealth: Payer: Self-pay

## 2019-01-27 MED ORDER — SYNTHROID 112 MCG PO TABS: 112.0000 ug | ORAL_TABLET | Freq: Every day | ORAL | 1 refills | Status: DC

## 2019-01-27 NOTE — Telephone Encounter (Signed)
Pt advised.   Thanks,   -Teniyah Seivert  

## 2019-01-27 NOTE — Telephone Encounter (Signed)
-----   Message from Jerrol Banana., MD sent at 01/26/2019  7:41 AM EDT ----- Level correct.

## 2019-01-28 MED ORDER — SYNTHROID 112 MCG PO TABS: 112.0000 ug | ORAL_TABLET | Freq: Every day | ORAL | 1 refills | Status: DC

## 2019-01-28 NOTE — Telephone Encounter (Signed)
Please review

## 2019-04-22 ENCOUNTER — Ambulatory Visit: Payer: Managed Care, Other (non HMO) | Admitting: Family Medicine

## 2019-04-27 ENCOUNTER — Ambulatory Visit: Payer: Self-pay | Admitting: Family Medicine

## 2019-04-28 ENCOUNTER — Other Ambulatory Visit: Payer: Self-pay

## 2019-04-28 ENCOUNTER — Encounter: Payer: Self-pay | Admitting: Family Medicine

## 2019-04-28 ENCOUNTER — Ambulatory Visit (INDEPENDENT_AMBULATORY_CARE_PROVIDER_SITE_OTHER): Payer: Managed Care, Other (non HMO) | Admitting: Family Medicine

## 2019-04-28 ENCOUNTER — Other Ambulatory Visit: Payer: Self-pay | Admitting: Family Medicine

## 2019-04-28 VITALS — BP 118/62 | HR 60 | Resp 16 | Wt 220.0 lb

## 2019-04-28 DIAGNOSIS — Z23 Encounter for immunization: Secondary | ICD-10-CM

## 2019-04-28 DIAGNOSIS — Z6835 Body mass index (BMI) 35.0-35.9, adult: Secondary | ICD-10-CM

## 2019-04-28 DIAGNOSIS — G35 Multiple sclerosis: Secondary | ICD-10-CM

## 2019-04-28 DIAGNOSIS — F411 Generalized anxiety disorder: Secondary | ICD-10-CM

## 2019-04-28 DIAGNOSIS — E559 Vitamin D deficiency, unspecified: Secondary | ICD-10-CM | POA: Diagnosis not present

## 2019-04-28 DIAGNOSIS — E039 Hypothyroidism, unspecified: Secondary | ICD-10-CM | POA: Diagnosis not present

## 2019-04-28 NOTE — Progress Notes (Signed)
Patient: Erica Noble Female    DOB: April 13, 1961   58 y.o.   MRN: [id number] Visit Date: 04/28/2019  Today's Provider: Wilhemena Durie, MD   Chief Complaint  Patient presents with  . Hypothyroidism   Subjective:    HPI  Here for thyroid check. She feels well today with no other complaints.  Multiple sclerosis slowly getting worse.  Patient is struggling emotionally a little bit due to this plus COVID pandemic.  It is time for follow-up lab work.  She also has not had vitamin D or B12 in the past. Lab Results  Component Value Date   TSH 2.790 01/25/2019    No Known Allergies   Current Outpatient Medications:  .  SYNTHROID 112 MCG tablet, Take 1 tablet (112 mcg total) by mouth daily before breakfast., Disp: 90 tablet, Rfl: 1  Review of Systems  Constitutional: Positive for fatigue.  Eyes: Negative.   Respiratory: Negative.   Cardiovascular: Negative.   Endocrine: Negative.   Musculoskeletal: Negative.   Allergic/Immunologic: Negative.   Neurological: Positive for weakness.  Psychiatric/Behavioral: Negative.     Social History   Tobacco Use  . Smoking status: Former Smoker    Quit date: 07/29/1986    Years since quitting: 32.7  . Smokeless tobacco: Never Used  Substance Use Topics  . Alcohol use: No      Objective:   BP 118/62   Pulse 60   Resp 16   SpO2 99%  Vitals:   04/28/19 1608  BP: 118/62  Pulse: 60  Resp: 16  SpO2: 99%  There is no height or weight on file to calculate BMI.   Physical Exam Vitals signs reviewed.  Constitutional:      Appearance: Normal appearance. She is obese.  HENT:     Head: Normocephalic and atraumatic.     Right Ear: Tympanic membrane, ear canal and external ear normal.     Left Ear: Tympanic membrane, ear canal and external ear normal.     Nose: Nose normal.     Mouth/Throat:     Pharynx: Oropharynx is clear.  Eyes:     General: No scleral icterus. Cardiovascular:     Rate and Rhythm: Normal rate and  regular rhythm.     Pulses: Normal pulses.     Heart sounds: Normal heart sounds.  Pulmonary:     Effort: Pulmonary effort is normal.     Breath sounds: Normal breath sounds.  Abdominal:     Palpations: Abdomen is soft.  Musculoskeletal:        General: No swelling.     Right lower leg: No edema.     Left lower leg: No edema.  Lymphadenopathy:     Cervical: No cervical adenopathy.  Skin:    General: Skin is warm and dry.  Neurological:     Mental Status: She is alert and oriented to person, place, and time. Mental status is at baseline.     Coordination: Coordination abnormal.     Comments: Progressive weakness from MS.  Psychiatric:        Mood and Affect: Mood normal.        Behavior: Behavior normal.        Thought Content: Thought content normal.        Judgment: Judgment normal.      No results found for any visits on 04/28/19.     Assessment & Plan    1. Adult hypothyroidism  - TSH  2. Multiple sclerosis [facility]) Per neurology. - Comprehensive metabolic panel - CBC with Differential/Platelet  3. Class 2 severe obesity due to excess calories with serious comorbidity and body mass index (BMI) of 35.0 to 35.9 in adult [facility]) Explained that weight loss will help her move about a little bit easier. - Comprehensive metabolic panel  4. Avitaminosis D Would like to get vitamin D level above 30. - VITAMIN D 25 Hydroxy (Vit-D Deficiency, Fractures)  5. GAD (generalized anxiety disorder) Chronic, ongoing.  Not debilitating at this point.  Return to clinic 6 months. - Vitamin B12  6. Flu vaccine need  - Flu Vaccine QUAD 6+ mos PF IM (Fluarix Quad PF)     Wilhemena Durie, MD  [facility]

## 2019-04-29 LAB — CBC WITH DIFFERENTIAL/PLATELET
Basophils Absolute: 0.1 10*3/uL (ref 0.0–0.2)
Basos: 1 %
EOS (ABSOLUTE): 0.1 10*3/uL (ref 0.0–0.4)
Eos: 1 %
Hematocrit: 38.7 % (ref 34.0–46.6)
Hemoglobin: 13.7 g/dL (ref 11.1–15.9)
Immature Grans (Abs): 0 10*3/uL (ref 0.0–0.1)
Immature Granulocytes: 0 %
Lymphocytes Absolute: 1.9 10*3/uL (ref 0.7–3.1)
Lymphs: 23 %
MCH: 32.6 pg (ref 26.6–33.0)
MCHC: 35.4 g/dL (ref 31.5–35.7)
MCV: 92 fL (ref 79–97)
Monocytes Absolute: 0.7 10*3/uL (ref 0.1–0.9)
Monocytes: 9 %
Neutrophils Absolute: 5.5 10*3/uL (ref 1.4–7.0)
Neutrophils: 66 %
Platelets: 216 10*3/uL (ref 150–450)
RBC: 4.2 x10E6/uL (ref 3.77–5.28)
RDW: 12.4 % (ref 11.7–15.4)
WBC: 8.4 10*3/uL (ref 3.4–10.8)

## 2019-04-29 LAB — COMPREHENSIVE METABOLIC PANEL
ALT: 14 IU/L (ref 0–32)
AST: 16 IU/L (ref 0–40)
Albumin/Globulin Ratio: 1.8 (ref 1.2–2.2)
Albumin: 4.2 g/dL (ref 3.8–4.9)
Alkaline Phosphatase: 69 IU/L (ref 39–117)
BUN/Creatinine Ratio: 16 (ref 9–23)
BUN: 13 mg/dL (ref 6–24)
Bilirubin Total: 0.3 mg/dL (ref 0.0–1.2)
CO2: 24 mmol/L (ref 20–29)
Calcium: 9.3 mg/dL (ref 8.7–10.2)
Chloride: 105 mmol/L (ref 96–106)
Creatinine, Ser: 0.8 mg/dL (ref 0.57–1.00)
GFR calc Af Amer: 95 mL/min/{1.73_m2} (ref >59)
GFR calc non Af Amer: 82 mL/min/{1.73_m2} (ref >59)
Globulin, Total: 2.4 g/dL (ref 1.5–4.5)
Glucose: 96 mg/dL (ref 65–99)
Potassium: 4.3 mmol/L (ref 3.5–5.2)
Sodium: 143 mmol/L (ref 134–144)
Total Protein: 6.6 g/dL (ref 6.0–8.5)

## 2019-04-29 LAB — VITAMIN B12: Vitamin B-12: 942 pg/mL (ref 232–1245)

## 2019-04-29 LAB — VITAMIN D 25 HYDROXY (VIT D DEFICIENCY, FRACTURES): Vit D, 25-Hydroxy: 38.8 ng/mL (ref 30.0–100.0)

## 2019-04-29 LAB — TSH: TSH: 4.21 u[IU]/mL (ref 0.450–4.500)

## 2019-05-03 ENCOUNTER — Telehealth: Payer: Self-pay

## 2019-05-03 MED ORDER — SYNTHROID 112 MCG PO TABS: 112.0000 ug | ORAL_TABLET | Freq: Every day | ORAL | 1 refills | Status: DC

## 2019-05-03 NOTE — Telephone Encounter (Signed)
Pt advised RX for Synthroid sent to [facility],   -Mickel Baas

## 2019-05-03 NOTE — Telephone Encounter (Signed)
-----   Message from Jerrol Banana., MD sent at 05/02/2019  9:21 AM EDT ----- Lab WNL.

## 2019-09-20 ENCOUNTER — Other Ambulatory Visit: Payer: Self-pay | Admitting: Neurology

## 2019-09-20 DIAGNOSIS — G35 Multiple sclerosis: Secondary | ICD-10-CM

## 2019-09-21 ENCOUNTER — Other Ambulatory Visit: Payer: Self-pay | Admitting: Family Medicine

## 2019-09-21 NOTE — Telephone Encounter (Signed)
Requested Prescriptions  Pending Prescriptions Disp Refills  . SYNTHROID 112 MCG tablet [Pharmacy Med Name: SYNTHROID  112MCG  TAB] 90 tablet 3    Sig: TAKE 1 TABLET BY MOUTH  DAILY BEFORE BREAKFAST     Endocrinology:  Hypothyroid Agents Failed - 09/21/2019  4:59 AM      Failed - TSH needs to be rechecked within 3 months after an abnormal result. Refill until TSH is due.      Passed - TSH in normal range and within 360 days    TSH  Date Value Ref Range Status  04/28/2019 4.210 0.450 - 4.500 uIU/mL Final         Passed - Valid encounter within last 12 months    Recent Outpatient Visits          4 months ago Adult hypothyroidism   [facility] Jerrol Banana., MD   7 months ago Adult hypothyroidism   [facility] Jerrol Banana., MD   1 year ago Annual physical exam   [facility] Jerrol Banana., MD   1 year ago Adult hypothyroidism   [facility] Jerrol Banana., MD   2 years ago Adult hypothyroidism   [facility] Jerrol Banana., MD

## 2019-09-29 ENCOUNTER — Ambulatory Visit
Admission: RE | Admit: 2019-09-29 | Discharge: 2019-09-29 | Disposition: A | Discharge status: Home / Self Care | Payer: Managed Care, Other (non HMO) | Source: Ambulatory Visit | Attending: Neurology | Admitting: Neurology

## 2019-09-29 ENCOUNTER — Other Ambulatory Visit: Payer: Self-pay

## 2019-09-29 DIAGNOSIS — G35 Multiple sclerosis: Secondary | ICD-10-CM | POA: Insufficient documentation

## 2019-09-29 MED ORDER — GADOBUTROL 1 MMOL/ML IV SOLN
10.0000 mL | Freq: Once | INTRAVENOUS | Status: AC | PRN
  Administered 2019-09-29: 10 mL via INTRAVENOUS

## 2019-10-14 ENCOUNTER — Ambulatory Visit: Payer: Managed Care, Other (non HMO)

## 2019-10-24 ENCOUNTER — Ambulatory Visit: Payer: Managed Care, Other (non HMO) | Attending: Internal Medicine

## 2019-10-24 DIAGNOSIS — Z23 Encounter for immunization: Secondary | ICD-10-CM

## 2019-10-24 NOTE — Progress Notes (Signed)
   Covid-19 Vaccination Clinic  Name:  Erica Noble    MRN: [id number] DOB: 12-07-1960  10/24/2019  [name] was observed post Covid-19 immunization for 15 minutes without incident. She was provided with Vaccine Information Sheet and instruction to access the V-Safe system.   [name] was instructed to call 911 with any severe reactions post vaccine: Marland Kitchen Difficulty breathing  . Swelling of face and throat  . A fast heartbeat  . A bad rash all over body  . Dizziness and weakness   Immunizations Administered    Name Date Dose VIS Date Route   Pfizer COVID-19 Vaccine 10/24/2019 11:11 AM 0.3 mL 07/09/2019 Intramuscular   Manufacturer: [employer]   Lot: [id number]   [place]: [id number]

## 2019-11-01 DIAGNOSIS — G35 Multiple sclerosis: Secondary | ICD-10-CM | POA: Insufficient documentation

## 2019-11-08 ENCOUNTER — Ambulatory Visit: Payer: Managed Care, Other (non HMO)

## 2019-11-17 ENCOUNTER — Ambulatory Visit: Payer: Managed Care, Other (non HMO) | Attending: Internal Medicine

## 2019-11-17 DIAGNOSIS — Z23 Encounter for immunization: Secondary | ICD-10-CM

## 2019-11-17 NOTE — Progress Notes (Signed)
   Covid-19 Vaccination Clinic  Name:  Erica Noble    MRN: [id number] DOB: 06-Feb-1961  11/17/2019  [name] was observed post Covid-19 immunization for 15 minutes without incident. She was provided with Vaccine Information Sheet and instruction to access the V-Safe system.   [name] was instructed to call 911 with any severe reactions post vaccine: Marland Kitchen Difficulty breathing  . Swelling of face and throat  . A fast heartbeat  . A bad rash all over body  . Dizziness and weakness   Immunizations Administered    Name Date Dose VIS Date Route   Pfizer COVID-19 Vaccine 11/17/2019  2:07 PM 0.3 mL 09/22/2018 Intramuscular   Manufacturer: [place]   Lot: [id number]   [place]: [id number]

## 2020-01-25 ENCOUNTER — Other Ambulatory Visit: Payer: Self-pay | Admitting: Family Medicine

## 2020-01-25 MED ORDER — SYNTHROID 112 MCG PO TABS: ORAL_TABLET | ORAL | 3 refills | Status: DC

## 2020-01-25 NOTE — Telephone Encounter (Signed)
Pt has made an appt for 05-01-2020. Pt needs a refill on synthroid 112 mcg. [facility]

## 2020-01-25 NOTE — Telephone Encounter (Signed)
Requested Prescriptions  Pending Prescriptions Disp Refills  . SYNTHROID 112 MCG tablet 90 tablet 3    Sig: TAKE 1 TABLET BY MOUTH  DAILY BEFORE BREAKFAST     Endocrinology:  Hypothyroid Agents Failed - 01/25/2020 11:30 AM      Failed - TSH needs to be rechecked within 3 months after an abnormal result. Refill until TSH is due.      Passed - TSH in normal range and within 360 days    TSH  Date Value Ref Range Status  04/28/2019 4.210 0.450 - 4.500 uIU/mL Final         Passed - Valid encounter within last 12 months    Recent Outpatient Visits          9 months ago Adult hypothyroidism   [facility] Jerrol Banana., MD   1 year ago Adult hypothyroidism   [facility] Jerrol Banana., MD   1 year ago Annual physical exam   [facility] Jerrol Banana., MD   1 year ago Adult hypothyroidism   [facility] Jerrol Banana., MD   2 years ago Adult hypothyroidism   [facility] Jerrol Banana., MD      Future Appointments            In 3 months Jerrol Banana., MD [facility], [place]

## 2020-03-23 ENCOUNTER — Other Ambulatory Visit: Payer: Self-pay | Admitting: Obstetrics and Gynecology

## 2020-03-23 DIAGNOSIS — Z1231 Encounter for screening mammogram for malignant neoplasm of breast: Secondary | ICD-10-CM

## 2020-03-30 ENCOUNTER — Ambulatory Visit
Admission: RE | Admit: 2020-03-30 | Discharge: 2020-03-30 | Disposition: A | Discharge status: Home / Self Care | Payer: Managed Care, Other (non HMO) | Source: Ambulatory Visit | Attending: Obstetrics and Gynecology | Admitting: Obstetrics and Gynecology

## 2020-03-30 ENCOUNTER — Other Ambulatory Visit: Payer: Self-pay

## 2020-03-30 DIAGNOSIS — Z1231 Encounter for screening mammogram for malignant neoplasm of breast: Secondary | ICD-10-CM | POA: Insufficient documentation

## 2020-04-12 ENCOUNTER — Other Ambulatory Visit: Payer: Self-pay

## 2020-04-12 ENCOUNTER — Encounter: Payer: Self-pay | Admitting: Dermatology

## 2020-04-12 ENCOUNTER — Ambulatory Visit: Payer: Managed Care, Other (non HMO) | Admitting: Dermatology

## 2020-04-12 ENCOUNTER — Other Ambulatory Visit: Payer: Self-pay | Admitting: Dermatology

## 2020-04-12 DIAGNOSIS — L814 Other melanin hyperpigmentation: Secondary | ICD-10-CM

## 2020-04-12 DIAGNOSIS — D485 Neoplasm of uncertain behavior of skin: Secondary | ICD-10-CM | POA: Diagnosis not present

## 2020-04-12 DIAGNOSIS — L821 Other seborrheic keratosis: Secondary | ICD-10-CM | POA: Diagnosis not present

## 2020-04-12 DIAGNOSIS — L578 Other skin changes due to chronic exposure to nonionizing radiation: Secondary | ICD-10-CM

## 2020-04-12 DIAGNOSIS — D2272 Melanocytic nevi of left lower limb, including hip: Secondary | ICD-10-CM | POA: Diagnosis not present

## 2020-04-12 DIAGNOSIS — D229 Melanocytic nevi, unspecified: Secondary | ICD-10-CM | POA: Diagnosis not present

## 2020-04-12 MED ORDER — MUPIROCIN 2 % EX OINT: 1.0000 "application " | TOPICAL_OINTMENT | Freq: Every day | CUTANEOUS | 0 refills | Status: DC

## 2020-04-12 NOTE — Progress Notes (Signed)
Follow-Up Visit   Subjective  Erica Noble is a 59 y.o. female who presents for the following: Skin Problem (Patient has a spot at left eyebrow that has gotten larger, present for a few years. ).  Patient does have a history of BCC and severe dysplastic nevus. Patient accompanied by husband.  The following portions of the chart were reviewed this encounter and updated as appropriate:  Tobacco  Allergies  Meds  Problems  Med Hx  Surg Hx  Fam Hx      Review of Systems:  No other skin or systemic complaints except as noted in HPI or Assessment and Plan.  Objective  Well appearing patient in no apparent distress; mood and affect are within normal limits.  A focused examination was performed including face, eyelids, neck, ears, arms, legs. Relevant physical exam findings are noted in the Assessment and Plan.  Objective  Left Medial Eyebrow: 0.9cm medium brown macule     Objective  Right Eyebrow: 0.1cm dark brown thin papule within 0.6cm tan macule     Left lateral calf  Objective  Left lateral calf: Dark brown thin papule 0.45cm    Images       Assessment & Plan  Neoplasm of uncertain behavior of skin (2) Left Medial Eyebrow  Skin / nail biopsy Type of biopsy: punch   Informed consent: discussed and consent obtained   Timeout: patient name, date of birth, surgical site, and procedure verified   Patient was prepped and draped in usual sterile fashion: Area prepped with isopropyl alcohol. Anesthesia: the lesion was anesthetized in a standard fashion   Anesthetic:  1% lidocaine w/ epinephrine 1-100,000 buffered w/ 8.4% NaHCO3 Punch size:  3 mm Suture size:  5-0 Suture type: nylon   Suture removal (days):  7 Hemostasis achieved with: suture and aluminum chloride   Outcome: patient tolerated procedure well   Post-procedure details: wound care instructions given   Additional details:  Mupirocin and a dressing applied  Right Eyebrow  Skin / nail  biopsy Type of biopsy: punch   Informed consent: discussed and consent obtained   Timeout: patient name, date of birth, surgical site, and procedure verified   Patient was prepped and draped in usual sterile fashion: Area prepped with isopropyl alcohol. Anesthesia: the lesion was anesthetized in a standard fashion   Anesthetic:  1% lidocaine w/ epinephrine 1-100,000 buffered w/ 8.4% NaHCO3 Punch size:  2 mm Suture size:  5-0 Suture type: nylon   Suture removal (days):  7 Hemostasis achieved with: suture and aluminum chloride   Outcome: patient tolerated procedure well   Post-procedure details: wound care instructions given   Additional details:  Mupirocin and a dressing applied  Start mupirocin daily with dressing changes  Other Related Procedures Anatomic Pathology Report  Ordered Medications: mupirocin ointment (BACTROBAN) 2 %  Nevus Left lateral calf  Benign-appearing.  Observation.  Call clinic for new or changing lesions.  Recommend daily use of broad spectrum spf 30+ sunscreen to sun-exposed areas.    Lentigines - Scattered tan macules - Discussed due to sun exposure - Benign, observe - Call for any changes  Seborrheic Keratoses - Stuck-on, waxy, tan-brown papules and plaques  - Discussed benign etiology and prognosis. - Observe - Call for any changes  Melanocytic Nevi - Tan-brown and/or pink-flesh-colored symmetric macules and papules - Benign appearing on exam today - Observation - Call clinic for new or changing moles - Recommend daily use of broad spectrum spf 30+ sunscreen to sun-exposed  areas.   Actinic Damage - diffuse scaly erythematous macules with underlying dyspigmentation - Recommend daily broad spectrum sunscreen SPF 30+ to sun-exposed areas, reapply every 2 hours as needed.  - Call for new or changing lesions.  Return in about 1 week (around 04/19/2020) for Suture Removal.  Graciella Belton, RMA, am acting as scribe for Forest Gleason, MD  .  Documentation: I have reviewed the above documentation for accuracy and completeness, and I agree with the above.  Forest Gleason, MD

## 2020-04-12 NOTE — Patient Instructions (Addendum)
Wound Care Instructions  1. Cleanse wound gently with soap and water once a day then pat dry with clean gauze. Apply a thing coat of Petrolatum (petroleum jelly, "Vaseline") over the wound (unless you have an allergy to this). We recommend that you use a new, sterile tube of Vaseline. Do not pick or remove scabs. Do not remove the yellow or white "healing tissue" from the base of the wound.  2. Cover the wound with fresh, clean, nonstick gauze and secure with paper tape. You may use Band-Aids in place of gauze and tape if the would is small enough, but would recommend trimming much of the tape off as there is often too much. Sometimes Band-Aids can irritate the skin.  3. You should call the office for your biopsy report after 1 week if you have not already been contacted.  4. If you experience any problems, such as abnormal amounts of bleeding, swelling, significant bruising, significant pain, or evidence of infection, please call the office immediately.  5. FOR ADULT SURGERY PATIENTS: If you need something for pain relief you may take 1 extra strength Tylenol (acetaminophen) AND 2 Ibuprofen (200mg each) together every 4 hours as needed for pain. (do not take these if you are allergic to them or if you have a reason you should not take them.) Typically, you may only need pain medication for 1 to 3 days.     Melanoma ABCDEs  Melanoma is the most dangerous type of skin cancer, and is the leading cause of death from skin disease.  You are more likely to develop melanoma if you:  Have light-colored skin, light-colored eyes, or red or blond hair  Spend a lot of time in the sun  Tan regularly, either outdoors or in a tanning bed  Have had blistering sunburns, especially during childhood  Have a close family member who has had a melanoma  Have atypical moles or large birthmarks  Early detection of melanoma is key since treatment is typically straightforward and cure rates are extremely high if  we catch it early.   The first sign of melanoma is often a change in a mole or a new dark spot.  The ABCDE system is a way of remembering the signs of melanoma.  A for asymmetry:  The two halves do not match. B for border:  The edges of the growth are irregular. C for color:  A mixture of colors are present instead of an even brown color. D for diameter:  Melanomas are usually (but not always) greater than 6mm - the size of a pencil eraser. E for evolution:  The spot keeps changing in size, shape, and color.  Please check your skin once per month between visits. You can use a small mirror in front and a large mirror behind you to keep an eye on the back side or your body.   If you see any new or changing lesions before your next follow-up, please call to schedule a visit.  Please continue daily skin protection including broad spectrum sunscreen SPF 30+ to sun-exposed areas, reapplying every 2 hours as needed when you're outdoors.     Recommend taking Heliocare sun protection supplement daily in sunny weather for additional sun protection. For maximum protection on the sunniest days, you can take up to 2 capsules of regular Heliocare OR take 1 capsule of Heliocare Ultra. For prolonged exposure (such as a full day in the sun), you can repeat your dose of the supplement 4 hours   after your first dose. Heliocare can be purchased at [facility] or at [email or web address].   

## 2020-04-19 ENCOUNTER — Ambulatory Visit (INDEPENDENT_AMBULATORY_CARE_PROVIDER_SITE_OTHER): Payer: Managed Care, Other (non HMO) | Admitting: Dermatology

## 2020-04-19 ENCOUNTER — Encounter: Payer: Self-pay | Admitting: Dermatology

## 2020-04-19 ENCOUNTER — Other Ambulatory Visit: Payer: Self-pay

## 2020-04-19 DIAGNOSIS — L821 Other seborrheic keratosis: Secondary | ICD-10-CM | POA: Diagnosis not present

## 2020-04-19 DIAGNOSIS — Z4802 Encounter for removal of sutures: Secondary | ICD-10-CM

## 2020-04-19 DIAGNOSIS — L814 Other melanin hyperpigmentation: Secondary | ICD-10-CM | POA: Diagnosis not present

## 2020-04-19 LAB — ANATOMIC PATHOLOGY REPORT

## 2020-04-19 NOTE — Progress Notes (Signed)
   Follow-Up Visit   Subjective  Erica Noble is a 59 y.o. female who presents for the following: Suture / Staple Removal (B/L eyebrows).  Patient presents today for suture removal for 2 punch biopsy sites. Bx site #1 Left medial eyebrow BX proven Seborrheic Keratosis, Bx site #2 Right eyebrow, BX proven Lentigo.  The following portions of the chart were reviewed this encounter and updated as appropriate:  Tobacco  Allergies  Meds  Problems  Med Hx  Surg Hx  Fam Hx      Review of Systems:  No other skin or systemic complaints except as noted in HPI or Assessment and Plan.  Objective  Well appearing patient in no apparent distress; mood and affect are within normal limits.  A focused examination was performed including Bilateral eyebrows. Relevant physical exam findings are noted in the Assessment and Plan.  Objective  Left Eyebrow, Right Eyebrow: Incision site is clean, dry and intact   Objective  Right Forehead: Tan macule  Objective  Left Medial Eyebrow: Bx proven SK   Assessment & Plan  Encounter for removal of sutures (2) Left Eyebrow; Right Eyebrow  Wound cleansed, sutures removed, wound cleansed and steri strips applied. Discussed pathology results showing lentigo and seborrheic keratosis.   Lentigo Right Forehead  Biopsy proven. Observation.  Call clinic for new or changing lesions.  Recommend daily use of broad spectrum spf 30+ sunscreen to sun-exposed areas.    Seborrheic keratosis Left Medial Eyebrow  Biopsy proven. Observation.  Call clinic for new or changing lesions.  Recommend daily use of broad spectrum spf 30+ sunscreen to sun-exposed areas.    Return if symptoms worsen or fail to improve, for As Scheduled.  IDonzetta Kohut, CMA, am acting as scribe for Forest Gleason, MD .  Documentation: I have reviewed the above documentation for accuracy and completeness, and I agree with the above.  Forest Gleason, MD

## 2020-04-24 ENCOUNTER — Encounter: Payer: Self-pay | Admitting: Dermatology

## 2020-04-24 ENCOUNTER — Telehealth: Payer: Self-pay | Admitting: Family Medicine

## 2020-04-24 NOTE — Telephone Encounter (Signed)
Patient requesting a 90 day supply of SYNTHROID 112 MCG tablet, informed patient please allow 48 to 72 hour turn around time.   [address], [address] Phone:  [phone]  Fax:  [phone]

## 2020-04-24 NOTE — Telephone Encounter (Signed)
TC to patient. Left detailed VM regarding her request for a synthroid refill.

## 2020-05-01 ENCOUNTER — Ambulatory Visit: Payer: Managed Care, Other (non HMO) | Admitting: Family Medicine

## 2020-05-02 NOTE — Progress Notes (Signed)
Specimen 1-Skin Biopsy, Left Medial Eyebrow: MACULAR  SEBORRHEIC KERATOSIS.  -->  Benign.  No treatment needed  Specimen 2-Skin Biopsy, Right Eyebrow: LENTIGO. Benign.  No treatment needed.  Results already reviewed with patient at her suture removal visit

## 2020-05-03 ENCOUNTER — Other Ambulatory Visit: Payer: Self-pay

## 2020-05-03 ENCOUNTER — Ambulatory Visit: Payer: Managed Care, Other (non HMO) | Admitting: Family Medicine

## 2020-05-03 ENCOUNTER — Encounter: Payer: Self-pay | Admitting: Family Medicine

## 2020-05-03 VITALS — BP 118/64 | HR 54 | Temp 98.3°F | Ht 65.0 in | Wt 239.0 lb

## 2020-05-03 DIAGNOSIS — G8929 Other chronic pain: Secondary | ICD-10-CM

## 2020-05-03 DIAGNOSIS — G35 Multiple sclerosis: Secondary | ICD-10-CM

## 2020-05-03 DIAGNOSIS — E039 Hypothyroidism, unspecified: Secondary | ICD-10-CM | POA: Diagnosis not present

## 2020-05-03 DIAGNOSIS — M545 Low back pain, unspecified: Secondary | ICD-10-CM

## 2020-05-03 DIAGNOSIS — Z23 Encounter for immunization: Secondary | ICD-10-CM

## 2020-05-03 DIAGNOSIS — Z6835 Body mass index (BMI) 35.0-35.9, adult: Secondary | ICD-10-CM

## 2020-05-03 DIAGNOSIS — E782 Mixed hyperlipidemia: Secondary | ICD-10-CM

## 2020-05-03 NOTE — Progress Notes (Signed)
Established patient visit   Patient: Erica Noble   DOB: [birth year]   59 y.o. Female  MRN: [id number] Visit Date: 05/03/2020  Today's healthcare provider: Wilhemena Durie, MD   Chief Complaint  Patient presents with  . Hypothyroidism   Subjective    HPI  Comes in today for follow-up.  Her MS has been progressive.  She is able to ambulate around the house some.  This is with some difficulty.  He states she has gained weight because she cannot cannot exercise at all.  We discussed cutting out carbs. Hypothyroid, follow-up  Lab Results  Component Value Date   TSH 4.210 04/28/2019   TSH 2.790 01/25/2019   TSH 4.670 (H) 08/12/2018   Wt Readings from Last 3 Encounters:  05/03/20 239 lb (108.4 kg)  04/28/19 220 lb (99.8 kg)  01/25/19 221 lb (100.2 kg)    She was last seen for hypothyroid 1 years ago.  Management since that visit includes; labs checked showing-WNL. She reports excellent compliance with treatment. She is not having side effects.   -----------------------------------------------------------------------------------------      Medications: Outpatient Medications Prior to Visit  Medication Sig  . mupirocin ointment (BACTROBAN) 2 % Apply 1 application topically daily.  Marland Kitchen SYNTHROID 112 MCG tablet TAKE 1 TABLET BY MOUTH  DAILY BEFORE BREAKFAST   No facility-administered medications prior to visit.    Review of Systems  Constitutional: Negative.  Negative for appetite change, chills, fatigue and fever.  Respiratory: Negative.  Negative for chest tightness and shortness of breath.   Cardiovascular: Negative.  Negative for chest pain and palpitations.  Gastrointestinal: Negative.  Negative for abdominal pain, nausea and vomiting.  Neurological: Positive for weakness. Negative for dizziness.      Objective    BP 118/64 (BP Location: Right Arm, Patient Position: Sitting, Cuff Size: Large)   Pulse (!) 54   Temp 98.3 F (36.8 C) (Oral)   Ht 5\' 5"   (1.651 m)   Wt 239 lb (108.4 kg)   SpO2 98%   BMI 39.77 kg/m    Physical Exam Vitals reviewed.  Constitutional:      Appearance: Normal appearance. She is obese.  HENT:     Head: Normocephalic and atraumatic.     Right Ear: Tympanic membrane, ear canal and external ear normal.     Left Ear: Tympanic membrane, ear canal and external ear normal.     Nose: Nose normal.     Mouth/Throat:     Pharynx: Oropharynx is clear.  Eyes:     General: No scleral icterus. Cardiovascular:     Rate and Rhythm: Normal rate and regular rhythm.     Pulses: Normal pulses.     Heart sounds: Normal heart sounds.  Pulmonary:     Effort: Pulmonary effort is normal.     Breath sounds: Normal breath sounds.  Abdominal:     Palpations: Abdomen is soft.  Musculoskeletal:        General: No swelling.     Right lower leg: No edema.     Left lower leg: No edema.  Lymphadenopathy:     Cervical: No cervical adenopathy.  Skin:    General: Skin is warm and dry.  Neurological:     Mental Status: She is alert and oriented to person, place, and time. Mental status is at baseline.     Coordination: Coordination abnormal.     Comments: Progressive weakness from MS.  Psychiatric:  Mood and Affect: Mood normal.        Behavior: Behavior normal.        Thought Content: Thought content normal.        Judgment: Judgment normal.       No results found for any visits on 05/03/20.  Assessment & Plan     1. Acquired hypothyroidism Try to keep TSH euthyroid - TSH  2. Need for influenza vaccination  - Flu Vaccine QUAD 6+ mos PF IM (Fluarix Quad PF)  3. Multiple sclerosis (HCC) Progressive.  4. Class 2 severe obesity due to excess calories with serious comorbidity and body mass index (BMI) of 35.0 to 35.9 in adult [facility]) Work on cutting back on carbs. She also has upper lipidemia and chronic back pain.  5. Mixed hyperlipidemia   6. Chronic low back pain without sciatica, unspecified back pain  laterality    No follow-ups on file.      I, Wilhemena Durie, MD, have reviewed all documentation for this visit. The documentation on 05/09/20 for the exam, diagnosis, procedures, and orders are all accurate and complete.    Ruther Ephraim Cranford Mon, MD  [facility] [phone] (phone) [phone] (fax)  [place]

## 2020-05-04 ENCOUNTER — Telehealth: Payer: Self-pay | Admitting: Family Medicine

## 2020-05-04 LAB — TSH: TSH: 6.1 u[IU]/mL — ABNORMAL HIGH (ref 0.450–4.500)

## 2020-05-04 NOTE — Telephone Encounter (Signed)
Patient stated she is returning a call regarding test results.  And also she would like to know the status of some forms that had to be filled out.  Please call patient to discuss at [phone]

## 2020-05-05 ENCOUNTER — Other Ambulatory Visit: Payer: Self-pay

## 2020-05-05 MED ORDER — LEVOTHYROXINE SODIUM 125 MCG PO TABS: 125.0000 ug | ORAL_TABLET | Freq: Every day | ORAL | 6 refills | Status: DC

## 2020-05-08 ENCOUNTER — Telehealth: Payer: Self-pay | Admitting: Family Medicine

## 2020-05-08 NOTE — Telephone Encounter (Signed)
Pt called stating that she is supposed to start PT [facility] on 05/11/20. She states however, that she is not comfortable with the physical therapist coming to the house due to a nippy dog and other obstacles within the home. She states that she would prefer to be referred to the PT office in [facility] near [facility]. Please advise.

## 2020-05-08 NOTE — Telephone Encounter (Signed)
Pt calling again regarding forms. She states that she is needing this asap. Please advise.

## 2020-05-08 NOTE — Telephone Encounter (Signed)
Pt asks to disregard message as PCP was not the one who ordered PT for her.

## 2020-05-08 NOTE — Telephone Encounter (Signed)
Noted  

## 2020-05-12 NOTE — Telephone Encounter (Signed)
Patient was advised forms are ready to pick up.

## 2020-06-12 ENCOUNTER — Ambulatory Visit: Payer: Managed Care, Other (non HMO) | Admitting: Physical Therapy

## 2020-06-29 ENCOUNTER — Ambulatory Visit: Payer: Managed Care, Other (non HMO) | Admitting: Physical Therapy

## 2020-06-29 ENCOUNTER — Telehealth: Payer: Self-pay

## 2020-06-29 NOTE — Telephone Encounter (Signed)
Copied from [place] [id number]. Topic: General - Other >> Jun 29, 2020  3:34 PM Hinda Lenis D wrote: PT husband requesting this medication to be send to this pharmacy / unable to update in epic  SYNTHROID [place] tablet [[id number]] [facility] [phone] [address], [place] [address]

## 2020-06-30 MED ORDER — LEVOTHYROXINE SODIUM 125 MCG PO TABS: 125.0000 ug | ORAL_TABLET | Freq: Every day | ORAL | 3 refills | Status: DC

## 2020-06-30 NOTE — Telephone Encounter (Signed)
Rx sent to pharmacy   

## 2020-06-30 NOTE — Addendum Note (Signed)
Addended by: Julieta Bellini on: 06/30/2020 11:03 AM   Modules accepted: Orders

## 2020-07-06 ENCOUNTER — Ambulatory Visit: Payer: Managed Care, Other (non HMO) | Attending: Neurology | Admitting: Physical Therapy

## 2020-07-06 ENCOUNTER — Other Ambulatory Visit: Payer: Self-pay

## 2020-07-06 DIAGNOSIS — R2689 Other abnormalities of gait and mobility: Secondary | ICD-10-CM | POA: Diagnosis present

## 2020-07-06 DIAGNOSIS — G35 Multiple sclerosis: Secondary | ICD-10-CM | POA: Diagnosis present

## 2020-07-06 DIAGNOSIS — M6281 Muscle weakness (generalized): Secondary | ICD-10-CM

## 2020-07-06 DIAGNOSIS — R269 Unspecified abnormalities of gait and mobility: Secondary | ICD-10-CM | POA: Diagnosis present

## 2020-07-06 DIAGNOSIS — G35D Multiple sclerosis, unspecified: Secondary | ICD-10-CM

## 2020-07-10 ENCOUNTER — Encounter: Payer: Self-pay | Admitting: Physical Therapy

## 2020-07-10 NOTE — Therapy (Signed)
[facility] [address] [place], [place], [address] Phone: [phone]   Fax:  [phone]  Physical Therapy Evaluation  Patient Details  Name: Erica Noble MRN: [id number] Date of Birth: Mar 18, 1961 Referring Provider (PT): Dr. Jennings Books   Encounter Date: 07/06/2020   PT End of Session - 07/10/20 1927    Visit Number 1    Number of Visits 9    Date for PT Re-Evaluation 08/03/20    Authorization - Visit Number 1    Authorization - Number of Visits 10    PT Start Time 7616    PT Stop Time 1440    PT Time Calculation (min) 48 min    Equipment Utilized During Treatment Gait belt    Activity Tolerance Patient tolerated treatment well;Patient limited by fatigue    Behavior During Therapy [facility] for tasks assessed/performed           Past Medical History:  Diagnosis Date  . Allergy 05/26/2009   Allergic rhinitis  . Basal cell carcinoma 01/17/2010   left upper lip/Moh's Dr. Lacinda Axon  . Cancer ([place])    skin ca  . History of dysplastic nevus 12/11/2007   mid back/severe  . Hyperlipidemia   . Multiple sclerosis ([place])    2008 diagnosis    Past Surgical History:  Procedure Laterality Date  . BREAST BIOPSY Right 2014   bx/clip- neg  . CHOLECYSTECTOMY  2000  . history of thyroidectomy  2008    There were no vitals filed for this visit.    Subjective Assessment - 07/10/20 1919    Subjective Pt. referred to PT secondary to progressive worsening in walking/ balance.  Pt. has had falls recently with 1 fall requiring max assist x 2 to return to standing.  Pt. entered PT clinic with QC and husband assist.    Patient is accompained by: Family member    Pertinent History Pt. had PT 5 years ago due to balance/ gait issues. Pt. has primary chronic progressive multiple sclerosis.    Limitations Lifting;Standing;Walking    Patient Stated Goals improve LE muscle strength/ walking/ safety.    Currently in Pain? Yes    Pain Location  Foot    Pain Orientation Left    Pain Radiating Towards L plantar foot pain.    Aggravating Factors  Walking              [facility] PT Assessment - 07/10/20 0001      Assessment   Medical Diagnosis Multiple Sclerosis/ Gait Difficulty    Referring Provider (PT) Dr. Jennings Books    Onset Date/Surgical Date 02/27/07    Next MD Visit 08/23/20    Prior Therapy yes      Precautions   Precautions Fall      Balance Screen   Has the patient fallen in the past 6 months Yes    How many times? 2+    Has the patient had a decrease in activity level because of a fear of falling?  Yes    Is the patient reluctant to leave their home because of a fear of falling?  Yes      [place] residence      Prior Function   Level of Independence Independent with basic ADLs            See flowsheet    Objective measurements completed on examination: See above findings.     Will issue HEP  next tx. Session    PT Education - 07/10/20 1926    Education provided Yes    Education Details Pt. educated in use of rollator for all aspects of standing/walking.    Person(s) Educated Patient;Spouse    Methods Explanation;Demonstration    Comprehension Verbalized understanding;Returned demonstration               PT Long Term Goals - 07/10/20 1936      PT LONG TERM GOAL #1   Title Pt. I with HEP to increase B LE muscle strength to grossly 4+/5 MMT to improve hip flexion/ gait pattern/ prevent falls.      Baseline Pt. presents with generalized B hip muscle weakness: R LE muscle strength grossly 4+/5 and L LE 4/5 MMT except B DF/hip flexion grossly 3/5 MMT (limited hip flexion during gait/ step pattern). Pt. scored a 44/56 on Berg    Time 4    Period Weeks    Status New    Target Date 08/03/20      PT LONG TERM GOAL #2   Title Pt. will ambulate with consistent hip flexion/ heel strike/ step pattern and length with least assistive device on level surfaces  safely.    Baseline Pt. ambulates with shuffling/festinating gait pattern with poor hip flexion/ step pattern/ heel strike. Pt. has wide BOS with very small shuffling gait pattern and takes side steps rather than walking forward. Poor ability to turn without UE assist/ device in clinic. Pt. able to correct gait pattern with B UE assist and max. verbal/tactile cuing.    Time 4    Period Weeks    Status New    Target Date 08/03/20      PT LONG TERM GOAL #3   Title Pt. will increase Berg balance test to >48 out of 56 to decrease fall risk/ promote safety with walkiing.    Baseline Initial Berg: 44    Time 4    Period Weeks    Status New    Target Date 08/03/20      PT LONG TERM GOAL #4   Title Pt. will increase FOTO to 48 to improve pain-free mobility.    Baseline Initial FOTO: 36    Time 4    Period Weeks    Status New    Target Date 08/03/20                  Plan - 07/10/20 1929    Clinical Impression Statement Pt. is a pleasant 59 y/o female with diagnosis of primary chronic progressive multiple sclerosis with gait/ balance issues.  Pt. reports L plantar foot pain with increase standing/ walking.  Pt. presents with generalized B hip muscle weakness: R LE muscle strength grossly 4+/5 and L LE 4/5 MMT except B DF/hip flexion grossly 3/5 MMT (limited hip flexion during gait/ step pattern). Pt. scored a 44/56 on Berg balance test and benefits greatly from use of rollator. Pt. ambulates with shuffling/festinating gait pattern with poor hip flexion/ step pattern/ heel strike.  Pt. has wide BOS with very small shuffling gait pattern and takes side steps rather than walking forward.  Poor ability to turn without UE assist/ device in clinic.  Pt. able to correct gait pattern with B UE assist and max. verbal/tactile cuing.  FOTO: initial 38/ goal 48.  Pt. will benefit from skilled PT services to increase B LE muscle strength to improve gait pattern/ safely/ use of appropriate assistive  device.    Stability/Clinical  Decision Making Unstable/Unpredictable    Clinical Decision Making High    Rehab Potential Fair    PT Frequency 2x / week    PT Duration 4 weeks    PT Treatment/Interventions ADLs/Self Care Home Management;Cryotherapy;Moist Heat;Balance training;Therapeutic exercise;Therapeutic activities;Manual techniques;Functional mobility training;Stair training;Gait training;Patient/family education;Neuromuscular re-education;Passive range of motion;Cognitive remediation;Energy conservation    PT Next Visit Plan Progress gait with rollator/balance and strengthening.  Issue HEP    PT Home Exercise Plan continue walking and home program     Consulted and Agree with Plan of Care Patient;Family member/caregiver           Patient will benefit from skilled therapeutic intervention in order to improve the following deficits and impairments:  Abnormal gait,Difficulty walking,Decreased range of motion,Decreased coordination,Decreased endurance,Decreased balance,Decreased activity tolerance,Decreased mobility,Decreased strength,Obesity,Hypomobility  Visit Diagnosis: Multiple sclerosis ([place])  Gait difficulty  Muscle weakness (generalized)  Balance problem     Problem List Patient Active Problem List   Diagnosis Date Noted  . Multiple sclerosis ([place]) 01/26/2019  . Polypharmacy 03/17/2015  . History of colon polyps 03/17/2015  . HLD (hyperlipidemia) 03/17/2015  . Adult hypothyroidism 03/17/2015  . Malaise and fatigue 03/17/2015  . DS (disseminated sclerosis) ([place]) 03/17/2015  . Post menopausal syndrome 03/17/2015  . Herpes zona 03/17/2015  . Disease of thyroid gland 03/17/2015  . Avitaminosis D 03/17/2015  . Back ache 01/30/2010  . Adiposity 01/30/2010  . Acquired hypothyroidism 05/26/2009  . Allergic rhinitis 05/26/2009   Pura Spice, PT, DPT # [phone] 07/10/2020, 7:41 PM  [facility] [address] [place], [place], [address] Phone: [phone]   Fax:  [phone]  Name: Erica Noble MRN: [id number] Date of Birth: 05-May-1961

## 2020-07-13 ENCOUNTER — Other Ambulatory Visit: Payer: Self-pay

## 2020-07-13 ENCOUNTER — Ambulatory Visit: Payer: Managed Care, Other (non HMO) | Admitting: Physical Therapy

## 2020-07-13 DIAGNOSIS — G35 Multiple sclerosis: Secondary | ICD-10-CM

## 2020-07-13 DIAGNOSIS — R2689 Other abnormalities of gait and mobility: Secondary | ICD-10-CM

## 2020-07-13 DIAGNOSIS — R269 Unspecified abnormalities of gait and mobility: Secondary | ICD-10-CM

## 2020-07-13 DIAGNOSIS — M6281 Muscle weakness (generalized): Secondary | ICD-10-CM

## 2020-07-14 ENCOUNTER — Encounter: Payer: Self-pay | Admitting: Physical Therapy

## 2020-07-14 NOTE — Therapy (Signed)
[place] [facility] [address] [place], [place], [address] Phone: [phone]   Fax:  [phone]  Physical Therapy Treatment  Patient Details  Name: Erica Noble MRN: [id number] Date of Birth: 29-Aug-1960 Referring Provider (PT): Dr. Jennings Books   Encounter Date: 07/13/2020   PT End of Session - 07/14/20 0718    Visit Number 2    Number of Visits 9    Date for PT Re-Evaluation 08/03/20    Authorization - Visit Number 2    Authorization - Number of Visits 10    PT Noble Time 0076    PT Stop Time 1438    PT Time Calculation (min) 44 min    Equipment Utilized During Treatment Gait belt    Activity Tolerance Patient tolerated treatment well;Patient limited by fatigue    Behavior During Therapy [facility] for tasks assessed/performed           Past Medical History:  Diagnosis Date   Allergy 05/26/2009   Allergic rhinitis   Basal cell carcinoma 01/17/2010   left upper lip/Moh's Dr. Lacinda Axon   Cancer [facility])    skin ca   History of dysplastic nevus 12/11/2007   mid back/severe   Hyperlipidemia    Multiple sclerosis ([place])    2008 diagnosis    Past Surgical History:  Procedure Laterality Date   BREAST BIOPSY Right 2014   bx/clip- neg   CHOLECYSTECTOMY  2000   history of thyroidectomy  2008    There were no vitals filed for this visit.   Subjective Assessment - 07/14/20 0713    Subjective Pt. reports no new complaints.  Pt. states she is tired today.  No falls.  Pt. entered PT while holding onto husbands arm (no assistive device).    Patient is accompained by: Family member    Pertinent History Pt. had PT 5 years ago due to balance/ gait issues. Pt. has primary chronic progressive multiple sclerosis.    Limitations Lifting;Standing;Walking    Patient Stated Goals improve LE muscle strength/ walking/ safety.    Currently in Pain? Yes    Pain Location Foot    Pain Orientation Left           Therex:  Resisted gait:  1BTB forward/backwards 4x each (light UE assist required)  Standing hip flexion 20x  Nustep L2 10 min. B UE/LE (discussed HEP)   Gait training:  Ambulate from waiting room to gym with use of rollator and max. Cuing to increase step pattern (esp. R LE step length past L foot).  Amb. In //-bars/ green hurdle step overs (cuing to correct posture and recip. Gait pattern)  Pt. Ambulates with marked improvement while verbally cuing pt. To increase step length/ heel strike/ slow down around PT clinic.        PT Long Term Goals - 07/10/20 1936      PT LONG TERM GOAL #1   Title Pt. I with HEP to increase B LE muscle strength to grossly 4+/5 MMT to improve hip flexion/ gait pattern/ prevent falls.      Baseline Pt. presents with generalized B hip muscle weakness: R LE muscle strength grossly 4+/5 and L LE 4/5 MMT except B DF/hip flexion grossly 3/5 MMT (limited hip flexion during gait/ step pattern). Pt. scored a 44/56 on Berg    Time 4    Period Weeks    Status New    Target Date 08/03/20      PT LONG TERM GOAL #  2   Title Pt. will ambulate with consistent hip flexion/ heel strike/ step pattern and length with least assistive device on level surfaces safely.    Baseline Pt. ambulates with shuffling/festinating gait pattern with poor hip flexion/ step pattern/ heel strike. Pt. has wide BOS with very small shuffling gait pattern and takes side steps rather than walking forward. Poor ability to turn without UE assist/ device in clinic. Pt. able to correct gait pattern with B UE assist and max. verbal/tactile cuing.    Time 4    Period Weeks    Status New    Target Date 08/03/20      PT LONG TERM GOAL #3   Title Pt. will increase Berg balance test to >48 out of 56 to decrease fall risk/ promote safety with walkiing.    Baseline Initial Berg: 44    Time 4    Period Weeks    Status New    Target Date 08/03/20      PT LONG TERM GOAL #4   Title Pt. will increase FOTO to 48 to improve  pain-free mobility.    Baseline Initial FOTO: 36    Time 4    Period Weeks    Status New    Target Date 08/03/20                 Plan - 07/14/20 0718    Clinical Impression Statement Mod. to max. verbal cuing to correct step pattern/ increase hip flexion/ heel strike and slow down gait pattern.  Pt. prefers a quick, shuffling gait pattern, esp. with initial steps after standing and during turning.  Pt. requires use of UE assist in //-bars/ rollator for safety and to ensure proper step pattern/ posture.  L foot pain persists during tx.  PT encouraged pt. to focus on step pattern and consistent step length.    Stability/Clinical Decision Making Unstable/Unpredictable    Clinical Decision Making High    Rehab Potential Fair    PT Frequency 2x / week    PT Duration 4 weeks    PT Treatment/Interventions ADLs/Self Care Home Management;Cryotherapy;Moist Heat;Balance training;Therapeutic exercise;Therapeutic activities;Manual techniques;Functional mobility training;Stair training;Gait training;Patient/family education;Neuromuscular re-education;Passive range of motion;Cognitive remediation;Energy conservation    PT Next Visit Plan Progress gait with rollator/balance and strengthening.  Issue HEP    PT Home Exercise Plan continue walking and home program     Consulted and Agree with Plan of Care Patient;Family member/caregiver           Patient will benefit from skilled therapeutic intervention in order to improve the following deficits and impairments:  Abnormal gait,Difficulty walking,Decreased range of motion,Decreased coordination,Decreased endurance,Decreased balance,Decreased activity tolerance,Decreased mobility,Decreased strength,Obesity,Hypomobility  Visit Diagnosis: Multiple sclerosis ([place])  Gait difficulty  Muscle weakness (generalized)  Balance problem     Problem List Patient Active Problem List   Diagnosis Date Noted   Multiple sclerosis ([place]) 01/26/2019    Polypharmacy 03/17/2015   History of colon polyps 03/17/2015   HLD (hyperlipidemia) 03/17/2015   Adult hypothyroidism 03/17/2015   Malaise and fatigue 03/17/2015   DS (disseminated sclerosis) ([place]) 03/17/2015   Post menopausal syndrome 03/17/2015   Herpes zona 03/17/2015   Disease of thyroid gland 03/17/2015   Avitaminosis D 03/17/2015   Back ache 01/30/2010   Adiposity 01/30/2010   Acquired hypothyroidism 05/26/2009   Allergic rhinitis 05/26/2009   Pura Spice, PT, DPT # [phone] 07/14/2020, 7:26 AM  [facility] [address] [place],  [place], [address] Phone: [phone]   Fax:  [phone]  Name: Erica Noble MRN: [id number] Date of Birth: 06/11/1961

## 2020-07-17 ENCOUNTER — Other Ambulatory Visit: Payer: Self-pay

## 2020-07-17 ENCOUNTER — Encounter: Payer: Self-pay | Admitting: Physical Therapy

## 2020-07-17 ENCOUNTER — Ambulatory Visit: Payer: Managed Care, Other (non HMO) | Admitting: Physical Therapy

## 2020-07-17 DIAGNOSIS — G35 Multiple sclerosis: Secondary | ICD-10-CM

## 2020-07-17 DIAGNOSIS — R2689 Other abnormalities of gait and mobility: Secondary | ICD-10-CM

## 2020-07-17 DIAGNOSIS — M6281 Muscle weakness (generalized): Secondary | ICD-10-CM

## 2020-07-17 DIAGNOSIS — R269 Unspecified abnormalities of gait and mobility: Secondary | ICD-10-CM

## 2020-07-17 NOTE — Therapy (Signed)
[place] [facility] [address] [place], [place], [address] Phone: [phone]   Fax:  [phone]  Physical Therapy Treatment  Patient Details  Name: Erica Noble MRN: [id number] Date of Birth: 1961/06/20 Referring Provider (PT): Dr. Jennings Books   Encounter Date: 07/17/2020   PT End of Session - 07/17/20 1820    Visit Number 3    Number of Visits 9    Date for PT Re-Evaluation 08/03/20    Authorization - Visit Number 3    Authorization - Number of Visits 10    PT Start Time 3810    PT Stop Time 1751    PT Time Calculation (min) 51 min    Equipment Utilized During Treatment Gait belt    Activity Tolerance Patient tolerated treatment well    Behavior During Therapy [facility] for tasks assessed/performed           Past Medical History:  Diagnosis Date  . Allergy 05/26/2009   Allergic rhinitis  . Basal cell carcinoma 01/17/2010   left upper lip/Moh's Dr. Lacinda Axon  . Cancer ([place])    skin ca  . History of dysplastic nevus 12/11/2007   mid back/severe  . Hyperlipidemia   . Multiple sclerosis ([place])    2008 diagnosis    Past Surgical History:  Procedure Laterality Date  . BREAST BIOPSY Right 2014   bx/clip- neg  . CHOLECYSTECTOMY  2000  . history of thyroidectomy  2008    There were no vitals filed for this visit.   Subjective Assessment - 07/17/20 1817    Subjective Pt. reports no L plantar foot pain at this time.  PT discussed benefits of orthotics/ proper shoewear/ stretches.  Pt. entered PT with short, shuffling gait pattern with no assistive device (holding onto husbands arm).    Patient is accompained by: Family member    Pertinent History Pt. had PT 5 years ago due to balance/ gait issues. Pt. has primary chronic progressive multiple sclerosis.    Limitations Lifting;Standing;Walking    Patient Stated Goals improve LE muscle strength/ walking/ safety.    Currently in Pain? No/denies             Therex:  Walking  forward in //-bars with cuing to increase hip flexion/ recip. Step pattern/ upright posture  Standing hip flexion 20x with UE support at //-bars  Partial lunges L/R 10x in //-bars.    Nustep L3 10 min. B UE/LE (discussed walking at home/ use of rollator)   Neuro.:  Walking over 3" plinths with heel clearance/ increase hip flexion.  Lateral walking on tandem Airex.  Tandem stance (ankle strategy focus)  Recip. Step touches (requires 1 UE assist on handrail) for safety.   Amb. In //-bars/ green hurdle step overs (cuing to correct posture and recip. Gait pattern)  Pt. Ambulates with marked improvement while verbally cuing pt. To increase step length/ heel strike/ slow down around PT clinic.     Ambulate from gym to waiting room with use of rollator and max. Cuing to increase step pattern (esp. R LE step length past L foot).       PT Long Term Goals - 07/10/20 1936      PT LONG TERM GOAL #1   Title Pt. I with HEP to increase B LE muscle strength to grossly 4+/5 MMT to improve hip flexion/ gait pattern/ prevent falls.      Baseline Pt. presents with generalized B hip muscle weakness: R LE muscle strength  grossly 4+/5 and L LE 4/5 MMT except B DF/hip flexion grossly 3/5 MMT (limited hip flexion during gait/ step pattern). Pt. scored a 44/56 on Berg    Time 4    Period Weeks    Status New    Target Date 08/03/20      PT LONG TERM GOAL #2   Title Pt. will ambulate with consistent hip flexion/ heel strike/ step pattern and length with least assistive device on level surfaces safely.    Baseline Pt. ambulates with shuffling/festinating gait pattern with poor hip flexion/ step pattern/ heel strike. Pt. has wide BOS with very small shuffling gait pattern and takes side steps rather than walking forward. Poor ability to turn without UE assist/ device in clinic. Pt. able to correct gait pattern with B UE assist and max. verbal/tactile cuing.    Time 4    Period Weeks    Status New     Target Date 08/03/20      PT LONG TERM GOAL #3   Title Pt. will increase Berg balance test to >48 out of 56 to decrease fall risk/ promote safety with walkiing.    Baseline Initial Berg: 44    Time 4    Period Weeks    Status New    Target Date 08/03/20      PT LONG TERM GOAL #4   Title Pt. will increase FOTO to 48 to improve pain-free mobility.    Baseline Initial FOTO: 36    Time 4    Period Weeks    Status New    Target Date 08/03/20                 Plan - 07/17/20 1821    Clinical Impression Statement Good LE muscle endurance with standing ther.ex./ Nustep.  No increase c/o L foot pain during dynamic balance tasks and hip ex. in //-bars.  Pt. requires UE assist for recip. step touches/ consistent hip flexion with hurdles/ Airex walking.  No LOB but pt. has difficulty with R swing through phase of gait past L foot position.  Pt. presents with L LE muscle weakness as compared to R LE.  Marked benefit with gait pattern while using rollator after several steps after standing from chair.    Stability/Clinical Decision Making Unstable/Unpredictable    Clinical Decision Making High    Rehab Potential Fair    PT Frequency 2x / week    PT Duration 4 weeks    PT Treatment/Interventions ADLs/Self Care Home Management;Cryotherapy;Moist Heat;Balance training;Therapeutic exercise;Therapeutic activities;Manual techniques;Functional mobility training;Stair training;Gait training;Patient/family education;Neuromuscular re-education;Passive range of motion;Cognitive remediation;Energy conservation    PT Next Visit Plan Progress gait with rollator/balance and strengthening.  Issue HEP    PT Home Exercise Plan continue walking and home program     Consulted and Agree with Plan of Care Patient;Family member/caregiver           Patient will benefit from skilled therapeutic intervention in order to improve the following deficits and impairments:  Abnormal gait,Difficulty walking,Decreased  range of motion,Decreased coordination,Decreased endurance,Decreased balance,Decreased activity tolerance,Decreased mobility,Decreased strength,Obesity,Hypomobility  Visit Diagnosis: Multiple sclerosis ([place])  Gait difficulty  Muscle weakness (generalized)  Balance problem     Problem List Patient Active Problem List   Diagnosis Date Noted  . Multiple sclerosis ([place]) 01/26/2019  . Polypharmacy 03/17/2015  . History of colon polyps 03/17/2015  . HLD (hyperlipidemia) 03/17/2015  . Adult hypothyroidism 03/17/2015  . Malaise and fatigue 03/17/2015  . DS (disseminated sclerosis) ([place])  03/17/2015  . Post menopausal syndrome 03/17/2015  . Herpes zona 03/17/2015  . Disease of thyroid gland 03/17/2015  . Avitaminosis D 03/17/2015  . Back ache 01/30/2010  . Adiposity 01/30/2010  . Acquired hypothyroidism 05/26/2009  . Allergic rhinitis 05/26/2009   Pura Spice, PT, DPT # [phone] 07/17/2020, 6:24 PM  [facility] [address] [place], [place], [address] Phone: [phone]   Fax:  [phone]  Name: JAQUELINE UBER MRN: [id number] Date of Birth: [birth year]

## 2020-07-20 ENCOUNTER — Ambulatory Visit: Payer: Managed Care, Other (non HMO) | Admitting: Physical Therapy

## 2020-07-27 ENCOUNTER — Ambulatory Visit: Payer: Managed Care, Other (non HMO) | Admitting: Physical Therapy

## 2020-07-27 ENCOUNTER — Other Ambulatory Visit: Payer: Self-pay

## 2020-07-27 ENCOUNTER — Encounter: Payer: Self-pay | Admitting: Physical Therapy

## 2020-07-27 DIAGNOSIS — M6281 Muscle weakness (generalized): Secondary | ICD-10-CM

## 2020-07-27 DIAGNOSIS — R2689 Other abnormalities of gait and mobility: Secondary | ICD-10-CM

## 2020-07-27 DIAGNOSIS — R269 Unspecified abnormalities of gait and mobility: Secondary | ICD-10-CM

## 2020-07-27 DIAGNOSIS — G35 Multiple sclerosis: Secondary | ICD-10-CM | POA: Diagnosis not present

## 2020-07-27 NOTE — Therapy (Signed)
[facility] [address] [place], [place], [address] Phone: [phone]   Fax:  [phone]  Physical Therapy Treatment  Patient Details  Name: Erica Noble MRN: [id number] Date of Birth: 18-Dec-1960 Referring Provider (PT): Dr. Jennings Books   Encounter Date: 07/27/2020   PT End of Session - 07/27/20 1358    Visit Number 4    Number of Visits 9    Date for PT Re-Evaluation 08/03/20    Authorization - Visit Number 4    Authorization - Number of Visits 10    PT Start Time [id number]    PT Stop Time 1437    PT Time Calculation (min) 50 min    Equipment Utilized During Treatment Gait belt    Activity Tolerance Patient tolerated treatment well    Behavior During Therapy [facility] for tasks assessed/performed           Past Medical History:  Diagnosis Date  . Allergy 05/26/2009   Allergic rhinitis  . Basal cell carcinoma 01/17/2010   left upper lip/Moh's Dr. Lacinda Axon  . Cancer ([place])    skin ca  . History of dysplastic nevus 12/11/2007   mid back/severe  . Hyperlipidemia   . Multiple sclerosis ([place])    2008 diagnosis    Past Surgical History:  Procedure Laterality Date  . BREAST BIOPSY Right 2014   bx/clip- neg  . CHOLECYSTECTOMY  2000  . history of thyroidectomy  2008    There were no vitals filed for this visit.   Subjective Assessment - 07/27/20 1357    Subjective No new complaints.  Pt. is not using rollator with walking.  Pt. using furniture/walls at home and husbands arm outside of house.    Patient is accompained by: Family member    Pertinent History Pt. had PT 5 years ago due to balance/ gait issues. Pt. has primary chronic progressive multiple sclerosis.    Limitations Lifting;Standing;Walking    Patient Stated Goals improve LE muscle strength/ walking/ safety.    Currently in Pain? Yes    Pain Score 5     Pain Location Foot    Pain Orientation Left    Pain Descriptors / Indicators Constant    Pain Type Chronic  pain             Therex:  Walking forward/ lateral in //-bars with cuing to increase hip flexion/ recip. Step pattern/ upright posture  Standing hip flexion/ heel raises 20x with UE support at //-bars  Partial lunges L/R 10x in //-bars.    Step ups in //-bars: R and L 10x each (light UE assist in //-bars).    Nustep L3 10 min. B UE/LE    Neuro.:  Tandem stance (ankle strategy focus)- 30 sec. Hold in //-bars (improvement noted).    Recip. Step touches (requires 1 UE assist on handrail) for safety.  Toe and heel strike (pt. Attempted to complete with no UE assist but unable).    Pt. Ambulates with marked improvement while verbally cuing pt. To increase step length/ heel strike/ slow down around PT clinic.   Ambulate from gym to waiting room with use of QC and max. Cuing to increase step pattern (esp. R LE step length past L foot).        PT Long Term Goals - 07/10/20 1936      PT LONG TERM GOAL #1   Title Pt. I with HEP to increase B LE muscle strength to grossly 4+/5 MMT  to improve hip flexion/ gait pattern/ prevent falls.      Baseline Pt. presents with generalized B hip muscle weakness: R LE muscle strength grossly 4+/5 and L LE 4/5 MMT except B DF/hip flexion grossly 3/5 MMT (limited hip flexion during gait/ step pattern). Pt. scored a 44/56 on Berg    Time 4    Period Weeks    Status New    Target Date 08/03/20      PT LONG TERM GOAL #2   Title Pt. will ambulate with consistent hip flexion/ heel strike/ step pattern and length with least assistive device on level surfaces safely.    Baseline Pt. ambulates with shuffling/festinating gait pattern with poor hip flexion/ step pattern/ heel strike. Pt. has wide BOS with very small shuffling gait pattern and takes side steps rather than walking forward. Poor ability to turn without UE assist/ device in clinic. Pt. able to correct gait pattern with B UE assist and max. verbal/tactile cuing.    Time 4    Period  Weeks    Status New    Target Date 08/03/20      PT LONG TERM GOAL #3   Title Pt. will increase Berg balance test to >48 out of 56 to decrease fall risk/ promote safety with walkiing.    Baseline Initial Berg: 44    Time 4    Period Weeks    Status New    Target Date 08/03/20      PT LONG TERM GOAL #4   Title Pt. will increase FOTO to 48 to improve pain-free mobility.    Baseline Initial FOTO: 36    Time 4    Period Weeks    Status New    Target Date 08/03/20                 Plan - 07/27/20 1358    Clinical Impression Statement Pt. requires moderate verbal cuing to increase hip flexion/ step pattern with walking outside of //-bars.  Pt. benefits from B UE support on rollator to increase hip flexion/ step length, esp. with R LE past L LE.  No LOB during tx. but pt. remains hesitant with increase shuffling gait pattern outside of //-bars.  Pt. instructed to focus on increase R LE step length (past L foot) to improve recip. gait pattern and safety.    Stability/Clinical Decision Making Unstable/Unpredictable    Clinical Decision Making High    Rehab Potential Fair    PT Frequency 2x / week    PT Duration 4 weeks    PT Treatment/Interventions ADLs/Self Care Home Management;Cryotherapy;Moist Heat;Balance training;Therapeutic exercise;Therapeutic activities;Manual techniques;Functional mobility training;Stair training;Gait training;Patient/family education;Neuromuscular re-education;Passive range of motion;Cognitive remediation;Energy conservation    PT Next Visit Plan Progress gait with rollator/balance and strengthening.    PT Home Exercise Plan continue walking and home program     Consulted and Agree with Plan of Care Patient;Family member/caregiver           Patient will benefit from skilled therapeutic intervention in order to improve the following deficits and impairments:  Abnormal gait,Difficulty walking,Decreased range of motion,Decreased coordination,Decreased  endurance,Decreased balance,Decreased activity tolerance,Decreased mobility,Decreased strength,Obesity,Hypomobility  Visit Diagnosis: Multiple sclerosis ([place])  Gait difficulty  Muscle weakness (generalized)  Balance problem     Problem List Patient Active Problem List   Diagnosis Date Noted  . Multiple sclerosis ([place]) 01/26/2019  . Polypharmacy 03/17/2015  . History of colon polyps 03/17/2015  . HLD (hyperlipidemia) 03/17/2015  . Adult hypothyroidism 03/17/2015  .  Malaise and fatigue 03/17/2015  . DS (disseminated sclerosis) ([facility]) 03/17/2015  . Post menopausal syndrome 03/17/2015  . Herpes zona 03/17/2015  . Disease of thyroid gland 03/17/2015  . Avitaminosis D 03/17/2015  . Back ache 01/30/2010  . Adiposity 01/30/2010  . Acquired hypothyroidism 05/26/2009  . Allergic rhinitis 05/26/2009    Cammie Mcgee, PT, DPT # [id number] 07/27/2020, 2:37 PM  [facility] [address] [place], [place], [address] Phone: [phone]   Fax:  [phone]  Name: RILLEY POULTER MRN: [id number] Date of Birth: [birth year]

## 2020-08-01 ENCOUNTER — Ambulatory Visit: Payer: Managed Care, Other (non HMO) | Attending: Neurology | Admitting: Physical Therapy

## 2020-08-01 ENCOUNTER — Other Ambulatory Visit: Payer: Self-pay

## 2020-08-01 DIAGNOSIS — R269 Unspecified abnormalities of gait and mobility: Secondary | ICD-10-CM | POA: Diagnosis present

## 2020-08-01 DIAGNOSIS — G35 Multiple sclerosis: Secondary | ICD-10-CM | POA: Diagnosis not present

## 2020-08-01 DIAGNOSIS — G35D Multiple sclerosis, unspecified: Secondary | ICD-10-CM

## 2020-08-01 DIAGNOSIS — M6281 Muscle weakness (generalized): Secondary | ICD-10-CM | POA: Diagnosis present

## 2020-08-01 DIAGNOSIS — R2689 Other abnormalities of gait and mobility: Secondary | ICD-10-CM

## 2020-08-02 ENCOUNTER — Encounter: Payer: Self-pay | Admitting: Physical Therapy

## 2020-08-02 NOTE — Therapy (Signed)
[facility] [address] [place], [place], [address] Phone: [phone]   Fax:  [phone]  Physical Therapy Treatment  Patient Details  Name: Erica Noble MRN: [id number] Date of Birth: February 17, 1961 Referring Provider (PT): Dr. Cristopher Peru   Encounter Date: 08/01/2020   PT End of Session - 08/02/20 1401    Visit Number 5    Number of Visits 9    Date for PT Re-Evaluation 08/03/20    Authorization - Visit Number 5    Authorization - Number of Visits 10    PT Start Time 1346    PT Stop Time 1435    PT Time Calculation (min) 49 min    Equipment Utilized During Treatment Gait belt    Activity Tolerance Patient tolerated treatment well    Behavior During Therapy [facility] for tasks assessed/performed           Past Medical History:  Diagnosis Date  . Allergy 05/26/2009   Allergic rhinitis  . Basal cell carcinoma 01/17/2010   left upper lip/Moh's Dr. Adriana Simas  . Cancer ([facility])    skin ca  . History of dysplastic nevus 12/11/2007   mid back/severe  . Hyperlipidemia   . Multiple sclerosis ([facility])    2008 diagnosis    Past Surgical History:  Procedure Laterality Date  . BREAST BIOPSY Right 2014   bx/clip- neg  . CHOLECYSTECTOMY  2000  . history of thyroidectomy  2008    There were no vitals filed for this visit.   Subjective Assessment - 08/02/20 1247    Subjective Pt. walked into PT clinic with assist of husbands R arm/hand and shuffling gait.  No new complaints and L foot continues to really hurt.    Patient is accompained by: Family member    Pertinent History Pt. had PT 5 years ago due to balance/ gait issues. Pt. has primary chronic progressive multiple sclerosis.    Limitations Lifting;Standing;Walking    Patient Stated Goals improve LE muscle strength/ walking/ safety.    Currently in Pain? Yes    Pain Score 6     Pain Location Foot    Pain Orientation Left    Pain Descriptors / Indicators Constant               Therex:   Walking forward/ backwards/ lateral in //-bars with cuing to increase hip flexion/ recip. Step pattern/ upright posture   Standing hip flexion/ abduction/ heel raises 20x with UE support at //-bars   Step ups in //-bars: R and L 10x each (light UE assist in //-bars).      Nustep L3 10 min. B UE/LE      Neuro.:     Recip. Step touches (requires 1 UE assist on handrail) for safety.  Toe and heel strike (pt. Attempted to complete with no UE assist but unable).     Pt. Ambulates with marked improvement while verbally cuing pt. To increase step length/ heel strike/ slow down around PT clinic.     Ambulate from gym to waiting room with use of QC and max. Cuing to increase step pattern (esp. R LE step length past L foot).       PT Long Term Goals - 07/10/20 1936      PT LONG TERM GOAL #1   Title Pt. I with HEP to increase B LE muscle strength to grossly 4+/5 MMT to improve hip flexion/ gait pattern/ prevent falls.      Baseline  Pt. presents with generalized B hip muscle weakness: R LE muscle strength grossly 4+/5 and L LE 4/5 MMT except B DF/hip flexion grossly 3/5 MMT (limited hip flexion during gait/ step pattern). Pt. scored a 44/56 on Berg    Time 4    Period Weeks    Status New    Target Date 08/03/20      PT LONG TERM GOAL #2   Title Pt. will ambulate with consistent hip flexion/ heel strike/ step pattern and length with least assistive device on level surfaces safely.    Baseline Pt. ambulates with shuffling/festinating gait pattern with poor hip flexion/ step pattern/ heel strike. Pt. has wide BOS with very small shuffling gait pattern and takes side steps rather than walking forward. Poor ability to turn without UE assist/ device in clinic. Pt. able to correct gait pattern with B UE assist and max. verbal/tactile cuing.    Time 4    Period Weeks    Status New    Target Date 08/03/20      PT LONG TERM GOAL #3   Title Pt. will increase Berg balance test to  >48 out of 56 to decrease fall risk/ promote safety with walkiing.    Baseline Initial Berg: 44    Time 4    Period Weeks    Status New    Target Date 08/03/20      PT LONG TERM GOAL #4   Title Pt. will increase FOTO to 48 to improve pain-free mobility.    Baseline Initial FOTO: 36    Time 4    Period Weeks    Status New    Target Date 08/03/20                 Plan - 08/02/20 1401    Clinical Impression Statement Pt. able to complete increase hip flexion/ R step length with cuing in //-bars and progression to use of QC outside of //-bars.  Pts. initial steps after standing are quick, shuffling with limited R step length.  PT provides moderate cuing to slow down steps and focus on increasing hip flexion/ heel strike/ step length, esp. on R LE.  Pt. understands importance of slowing down gait pattern to focus on proper technique to decrease shuffling/ lateral gait.    Stability/Clinical Decision Making Unstable/Unpredictable    Clinical Decision Making High    Rehab Potential Fair    PT Frequency 2x / week    PT Duration 4 weeks    PT Treatment/Interventions ADLs/Self Care Home Management;Cryotherapy;Moist Heat;Balance training;Therapeutic exercise;Therapeutic activities;Manual techniques;Functional mobility training;Stair training;Gait training;Patient/family education;Neuromuscular re-education;Passive range of motion;Cognitive remediation;Energy conservation    PT Next Visit Plan Progress gait with rollator/balance and strengthening.  CHECK GOALS NEXT [place] continue walking and home program     Consulted and Agree with Plan of Care Patient;Family member/caregiver           Patient will benefit from skilled therapeutic intervention in order to improve the following deficits and impairments:  Abnormal gait,Difficulty walking,Decreased range of motion,Decreased coordination,Decreased endurance,Decreased balance,Decreased activity tolerance,Decreased  mobility,Decreased strength,Obesity,Hypomobility  Visit Diagnosis: Multiple sclerosis ([place])  Gait difficulty  Muscle weakness (generalized)  Balance problem     Problem List Patient Active Problem List   Diagnosis Date Noted  . Multiple sclerosis ([place]) 01/26/2019  . Polypharmacy 03/17/2015  . History of colon polyps 03/17/2015  . HLD (hyperlipidemia) 03/17/2015  . Adult hypothyroidism 03/17/2015  . Malaise and fatigue 03/17/2015  .  DS (disseminated sclerosis) ([place]) 03/17/2015  . Post menopausal syndrome 03/17/2015  . Herpes zona 03/17/2015  . Disease of thyroid gland 03/17/2015  . Avitaminosis D 03/17/2015  . Back ache 01/30/2010  . Adiposity 01/30/2010  . Acquired hypothyroidism 05/26/2009  . Allergic rhinitis 05/26/2009   Pura Spice, PT, DPT # [id number] 08/03/2020, 7:59 AM  [facility] [address] [place], [place], [address] Phone: [phone]   Fax:  [phone]  Name: JEYLAH HOWORTH MRN: [id number] Date of Birth: 07/05/1961

## 2020-08-03 ENCOUNTER — Ambulatory Visit: Payer: Managed Care, Other (non HMO) | Admitting: Physical Therapy

## 2020-08-03 ENCOUNTER — Encounter: Payer: Self-pay | Admitting: Physical Therapy

## 2020-08-03 ENCOUNTER — Other Ambulatory Visit: Payer: Self-pay

## 2020-08-03 DIAGNOSIS — R2689 Other abnormalities of gait and mobility: Secondary | ICD-10-CM

## 2020-08-03 DIAGNOSIS — R269 Unspecified abnormalities of gait and mobility: Secondary | ICD-10-CM

## 2020-08-03 DIAGNOSIS — G35 Multiple sclerosis: Secondary | ICD-10-CM

## 2020-08-03 DIAGNOSIS — M6281 Muscle weakness (generalized): Secondary | ICD-10-CM

## 2020-08-03 NOTE — Therapy (Signed)
[facility] [address] [place], [place], [address] Phone: [phone]   Fax:  [phone]  Physical Therapy Treatment  Patient Details  Name: Erica Noble MRN: [id number] Date of Birth: 11-24-1960 Referring Provider (PT): Dr. Jennings Books   Encounter Date: 08/03/2020   PT End of Session - 08/07/20 1856    Visit Number 6    Number of Visits 14    Date for PT Re-Evaluation 08/31/20    Authorization - Visit Number 1    Authorization - Number of Visits 10    PT Start Time 5993    PT Stop Time 1440    PT Time Calculation (min) 52 min    Equipment Utilized During Treatment Gait belt    Activity Tolerance Patient tolerated treatment well    Behavior During Therapy [facility] for tasks assessed/performed           Past Medical History:  Diagnosis Date  . Allergy 05/26/2009   Allergic rhinitis  . Basal cell carcinoma 01/17/2010   left upper lip/Moh's Dr. Lacinda Axon  . Cancer ([place])    skin ca  . History of dysplastic nevus 12/11/2007   mid back/severe  . Hyperlipidemia   . Multiple sclerosis ([place])    2008 diagnosis    Past Surgical History:  Procedure Laterality Date  . BREAST BIOPSY Right 2014   bx/clip- neg  . CHOLECYSTECTOMY  2000  . history of thyroidectomy  2008    There were no vitals filed for this visit.   Subjective Assessment - 08/07/20 1846    Subjective Pt. walked into PT clinic with assist of husbands R arm/hand and shuffling gait.  PT discussed use of rollator (4-wheeled) with pt./ husband.  Pts. husband explained that pt. has a 3-wheeled and 4-wheeled rollator.  Pt. is not safe with 3-wheeled rollator.    Patient is accompained by: Family member    Pertinent History Pt. had PT 5 years ago due to balance/ gait issues. Pt. has primary chronic progressive multiple sclerosis.    Limitations Lifting;Standing;Walking    Patient Stated Goals improve LE muscle strength/ walking/ safety.    Currently in Pain? Yes    Pain  Score 5     Pain Location Foot    Pain Orientation Left              [facility] PT Assessment - 08/07/20 0001      Assessment   Medical Diagnosis Multiple Sclerosis/ Gait Difficulty    Referring Provider (PT) Dr. Jennings Books    Onset Date/Surgical Date 02/27/07    Prior Therapy yes      Precautions   Precautions Fall      Prior Function   Level of Independence Independent with basic ADLs           Therex:  Walking forward/ backwards/ lateralin //-bars with cuing to increase hip flexion/ recip. Step pattern/ upright posture  Standing hip flexion/ abduction/ heel raises20xwith UE support at //-bars  Step ups in //-bars: R and L 10x each (light UE assist in //-bars).  Nustep L310 min. B UE/LE    Neuro.:  Recip. Step touches (requires 1 UE assist on handrail) for safety.Toe and heel strike (pt. Attempted to complete with no UE assist but unable).  [place] taps (different colors) with 1 UE assist in //-bars.  Ambulate ingym with use of rollator and cuing to increase step pattern (esp. R LE step length past L foot).  Walking over green  hurdles/ plinth with 1 UE assist in //-bars 3x  Standing/walking head turns and CW/CCW in //-bars (1 UE assist)- moderate shuffling with turns      PT Long Term Goals - 08/07/20 1853      PT LONG TERM GOAL #1   Title Pt. I with HEP to increase B LE muscle strength to grossly 4+/5 MMT to improve hip flexion/ gait pattern/ prevent falls.      Baseline Pt. presents with generalized B hip muscle weakness: R LE muscle strength grossly 4+/5 and L LE 4/5 MMT except B DF/hip flexion grossly 3/5 MMT (limited hip flexion during gait/ step pattern). Pt. scored a 44/56 on Berg    Time 4    Period Weeks    Status On-going    Target Date 08/31/20      PT LONG TERM GOAL #2   Title Pt. will ambulate with consistent hip flexion/ heel strike/ step pattern and length with least assistive device on level surfaces safely.     Baseline Pt. ambulates with shuffling/festinating gait pattern with poor hip flexion/ step pattern/ heel strike. Pt. has wide BOS with very small shuffling gait pattern and takes side steps rather than walking forward. Poor ability to turn without UE assist/ device in clinic. Pt. able to correct gait pattern with B UE assist and max. verbal/tactile cuing.    Time 4    Period Weeks    Status Not Met    Target Date 08/31/20      PT LONG TERM GOAL #3   Title Pt. will increase Berg balance test to >48 out of 56 to decrease fall risk/ promote safety with walkiing.    Baseline Initial Berg: 44    Time 4    Period Weeks    Status On-going    Target Date 08/31/20      PT LONG TERM GOAL #4   Title Pt. will increase FOTO to 48 to improve pain-free mobility.    Baseline Initial FOTO: 36    Time 4    Period Weeks    Status On-going    Target Date 08/31/20              Plan - 08/07/20 1849    Clinical Impression Statement Moderate shuffling gait with limited hip/knee flexion upon entry into PT clinic/ over threshold changes.  Pt. requires moderate to max. cuing to correct gait pattern/ encourage heel strike and step pattern with light UE assist of //-bars or rollator.  Pt. requires several steps after standing to initiate proper gait pattern and prevent lateral walking.  Decrease R step length/ hip flexion noted and requires feedback to correct.  Pt. unable to complete standing proprioceptive tasks/ turning CW/ CCW without at least 1 UE assist on //-bars.  Pt. will bring rollator next tx. session.    Stability/Clinical Decision Making Unstable/Unpredictable    Clinical Decision Making High    Rehab Potential Fair    PT Frequency 2x / week    PT Duration 4 weeks    PT Treatment/Interventions ADLs/Self Care Home Management;Cryotherapy;Moist Heat;Balance training;Therapeutic exercise;Therapeutic activities;Manual techniques;Functional mobility training;Stair training;Gait training;Patient/family  education;Neuromuscular re-education;Passive range of motion;Cognitive remediation;Energy conservation    PT Next Visit Plan Progress gait with rollator/balance and strengthening.    PT Home Exercise Plan continue walking and home program     Consulted and Agree with Plan of Care Patient;Family member/caregiver           Patient will benefit from skilled therapeutic intervention  in order to improve the following deficits and impairments:  Abnormal gait,Difficulty walking,Decreased range of motion,Decreased coordination,Decreased endurance,Decreased balance,Decreased activity tolerance,Decreased mobility,Decreased strength,Obesity,Hypomobility  Visit Diagnosis: Multiple sclerosis ([place])  Gait difficulty  Muscle weakness (generalized)  Balance problem     Problem List Patient Active Problem List   Diagnosis Date Noted  . Multiple sclerosis ([place]) 01/26/2019  . Polypharmacy 03/17/2015  . History of colon polyps 03/17/2015  . HLD (hyperlipidemia) 03/17/2015  . Adult hypothyroidism 03/17/2015  . Malaise and fatigue 03/17/2015  . DS (disseminated sclerosis) ([place]) 03/17/2015  . Post menopausal syndrome 03/17/2015  . Herpes zona 03/17/2015  . Disease of thyroid gland 03/17/2015  . Avitaminosis D 03/17/2015  . Back ache 01/30/2010  . Adiposity 01/30/2010  . Acquired hypothyroidism 05/26/2009  . Allergic rhinitis 05/26/2009   Pura Spice, PT, DPT # [phone] 08/07/2020, 6:57 PM  [facility] [address] [place], [place], [address] Phone: [phone]   Fax:  [phone]  Name: Erica Noble MRN: [id number] Date of Birth: [birth year]

## 2020-08-08 ENCOUNTER — Ambulatory Visit: Payer: Managed Care, Other (non HMO) | Admitting: Physical Therapy

## 2020-08-08 ENCOUNTER — Other Ambulatory Visit: Payer: Self-pay

## 2020-08-08 DIAGNOSIS — M6281 Muscle weakness (generalized): Secondary | ICD-10-CM

## 2020-08-08 DIAGNOSIS — R2689 Other abnormalities of gait and mobility: Secondary | ICD-10-CM

## 2020-08-08 DIAGNOSIS — G35 Multiple sclerosis: Secondary | ICD-10-CM

## 2020-08-08 DIAGNOSIS — R269 Unspecified abnormalities of gait and mobility: Secondary | ICD-10-CM

## 2020-08-09 ENCOUNTER — Encounter: Payer: Self-pay | Admitting: Physical Therapy

## 2020-08-09 NOTE — Therapy (Signed)
[facility] [address] [place], [place], [address] Phone: [phone]   Fax:  [phone]  Physical Therapy Treatment  Patient Details  Name: Erica Noble MRN: [id number] Date of Birth: 05/29/1961 Referring Provider (PT): Dr. Jennings Books   Encounter Date: 08/08/2020   PT End of Session - 08/09/20 1837    Visit Number 7    Number of Visits 14    Date for PT Re-Evaluation 08/31/20    Authorization - Visit Number 2    Authorization - Number of Visits 10    PT Start Time 1962    PT Stop Time 1432    PT Time Calculation (min) 44 min    Equipment Utilized During Treatment Gait belt    Activity Tolerance Patient tolerated treatment well    Behavior During Therapy [facility] for tasks assessed/performed           Past Medical History:  Diagnosis Date  . Allergy 05/26/2009   Allergic rhinitis  . Basal cell carcinoma 01/17/2010   left upper lip/Moh's Dr. Lacinda Axon  . Cancer ([place])    skin ca  . History of dysplastic nevus 12/11/2007   mid back/severe  . Hyperlipidemia   . Multiple sclerosis ([place])    2008 diagnosis    Past Surgical History:  Procedure Laterality Date  . BREAST BIOPSY Right 2014   bx/clip- neg  . CHOLECYSTECTOMY  2000  . history of thyroidectomy  2008    There were no vitals filed for this visit.   Subjective Assessment - 08/09/20 1833    Subjective Pt. continues to c/o L foot pain (at base of metatarsals) with walking.  Pt. entered PT with use of husband's R arm.    Patient is accompained by: Family member    Pertinent History Pt. had PT 5 years ago due to balance/ gait issues. Pt. has primary chronic progressive multiple sclerosis.    Limitations Lifting;Standing;Walking    Patient Stated Goals improve LE muscle strength/ walking/ safety.    Currently in Pain? Yes    Pain Score 5     Pain Location Foot    Pain Orientation Left            Therex:  Walking forward/backwards/lateralin //-bars  with cuing to increase hip flexion/ recip. Step pattern/ upright posture  Standing hip flexion/abduction/heel raises20xwith UE support at //-bars  Seated L foot assessment  Nustep L310 min. B UE/LE    Neuro.:  Recip. Cone taps at //-bars.  Attempted with light UE but decrease balance/ pt. Fearful of falling with no UE assist.   Ambulate ingym with use of rollator and cuing to increase step pattern (esp. R LE step length past L foot).  Resisted gait 2BTB 4x forward/ backwards.  Lateral walking 2x (limited by L forefoot pain).     Standing posture correction (mirror feedback).       PT Long Term Goals - 08/07/20 1853      PT LONG TERM GOAL #1   Title Pt. I with HEP to increase B LE muscle strength to grossly 4+/5 MMT to improve hip flexion/ gait pattern/ prevent falls.      Baseline Pt. presents with generalized B hip muscle weakness: R LE muscle strength grossly 4+/5 and L LE 4/5 MMT except B DF/hip flexion grossly 3/5 MMT (limited hip flexion during gait/ step pattern). Pt. scored a 44/56 on Berg    Time 4    Period Weeks    Status  On-going    Target Date 08/31/20      PT LONG TERM GOAL #2   Title Pt. will ambulate with consistent hip flexion/ heel strike/ step pattern and length with least assistive device on level surfaces safely.    Baseline Pt. ambulates with shuffling/festinating gait pattern with poor hip flexion/ step pattern/ heel strike. Pt. has wide BOS with very small shuffling gait pattern and takes side steps rather than walking forward. Poor ability to turn without UE assist/ device in clinic. Pt. able to correct gait pattern with B UE assist and max. verbal/tactile cuing.    Time 4    Period Weeks    Status Not Met    Target Date 08/31/20      PT LONG TERM GOAL #3   Title Pt. will increase Berg balance test to >48 out of 56 to decrease fall risk/ promote safety with walkiing.    Baseline Initial Berg: 44    Time 4    Period Weeks     Status On-going    Target Date 08/31/20      PT LONG TERM GOAL #4   Title Pt. will increase FOTO to 48 to improve pain-free mobility.    Baseline Initial FOTO: 36    Time 4    Period Weeks    Status On-going    Target Date 08/31/20                 Plan - 08/09/20 1838    Clinical Impression Statement Pt. works hard during PT with focus on increase hip flexion/ step length, esp. with R LE swing through phase of gait.  L foot pain limited pts. ability to complete lateral walking with resistance and cone taps with L LE wt. bearing.  PT reassessed L great toe extension/ pain at base of metatarsals.  No change to HEP.  Pt. encouraged to use rollator with walking outside.    Stability/Clinical Decision Making Unstable/Unpredictable    Clinical Decision Making High    Rehab Potential Fair    PT Frequency 2x / week    PT Duration 4 weeks    PT Treatment/Interventions ADLs/Self Care Home Management;Cryotherapy;Moist Heat;Balance training;Therapeutic exercise;Therapeutic activities;Manual techniques;Functional mobility training;Stair training;Gait training;Patient/family education;Neuromuscular re-education;Passive range of motion;Cognitive remediation;Energy conservation    PT Next Visit Plan Progress gait with rollator/balance and strengthening.   BERG balance test.    PT Home Exercise Plan continue walking and home program     Consulted and Agree with Plan of Care Patient;Family member/caregiver           Patient will benefit from skilled therapeutic intervention in order to improve the following deficits and impairments:  Abnormal gait,Difficulty walking,Decreased range of motion,Decreased coordination,Decreased endurance,Decreased balance,Decreased activity tolerance,Decreased mobility,Decreased strength,Obesity,Hypomobility  Visit Diagnosis: Multiple sclerosis ([place])  Gait difficulty  Muscle weakness (generalized)  Balance problem     Problem List Patient Active Problem  List   Diagnosis Date Noted  . Multiple sclerosis ([place]) 01/26/2019  . Polypharmacy 03/17/2015  . History of colon polyps 03/17/2015  . HLD (hyperlipidemia) 03/17/2015  . Adult hypothyroidism 03/17/2015  . Malaise and fatigue 03/17/2015  . DS (disseminated sclerosis) ([place]) 03/17/2015  . Post menopausal syndrome 03/17/2015  . Herpes zona 03/17/2015  . Disease of thyroid gland 03/17/2015  . Avitaminosis D 03/17/2015  . Back ache 01/30/2010  . Adiposity 01/30/2010  . Acquired hypothyroidism 05/26/2009  . Allergic rhinitis 05/26/2009   Pura Spice, PT, DPT # [phone] 08/09/2020, 6:42 PM  [facility] [address] [place], [place], [address] Phone: [phone]   Fax:  [phone]  Name: Erica Noble MRN: [id number] Date of Birth: 02/12/1961

## 2020-08-10 ENCOUNTER — Other Ambulatory Visit: Payer: Self-pay

## 2020-08-10 ENCOUNTER — Ambulatory Visit: Payer: Managed Care, Other (non HMO) | Admitting: Physical Therapy

## 2020-08-10 DIAGNOSIS — G35 Multiple sclerosis: Secondary | ICD-10-CM

## 2020-08-10 DIAGNOSIS — M6281 Muscle weakness (generalized): Secondary | ICD-10-CM

## 2020-08-10 DIAGNOSIS — R269 Unspecified abnormalities of gait and mobility: Secondary | ICD-10-CM

## 2020-08-10 DIAGNOSIS — R2689 Other abnormalities of gait and mobility: Secondary | ICD-10-CM

## 2020-08-11 ENCOUNTER — Encounter: Payer: Self-pay | Admitting: Physical Therapy

## 2020-08-11 NOTE — Therapy (Addendum)
[facility] [address] [place], [place], [address] Phone: [phone]   Fax:  [phone]  Physical Therapy Treatment  Patient Details  Name: Erica Noble MRN: [id number] Date of Birth: [birth year] Referring Provider (PT): Dr. Jennings Books   Encounter Date: 08/10/2020     PT End of Session - 08/11/20 1635    Visit Number 8    Number of Visits 14    Date for PT Re-Evaluation 08/31/20    Authorization - Visit Number 3    Authorization - Number of Visits 10    PT Start Time 1601    PT Stop Time 1445    PT Time Calculation (min) 50 min    Equipment Utilized During Treatment Gait belt    Activity Tolerance Patient tolerated treatment well    Behavior During Therapy [facility] for tasks assessed/performed             Past Medical History:  Diagnosis Date  . Allergy 05/26/2009   Allergic rhinitis  . Basal cell carcinoma 01/17/2010   left upper lip/Moh's Dr. Lacinda Axon  . Cancer ([place])    skin ca  . History of dysplastic nevus 12/11/2007   mid back/severe  . Hyperlipidemia   . Multiple sclerosis ([place])    2008 diagnosis    Past Surgical History:  Procedure Laterality Date  . BREAST BIOPSY Right 2014   bx/clip- neg  . CHOLECYSTECTOMY  2000  . history of thyroidectomy  2008    There were no vitals filed for this visit.   Subjective Assessment - 08/14/20 1702    Subjective Pt. reports no falls and entered PT with shuffling gait.  Pt. able to correct gait pattern with verbal cuing and instruction to increase R hip flexion/ step length.    Patient is accompained by: Family member    Pertinent History Pt. had PT 5 years ago due to balance/ gait issues. Pt. has primary chronic progressive multiple sclerosis.    Limitations Lifting;Standing;Walking    Patient Stated Goals improve LE muscle strength/ walking/ safety.    Currently in Pain? Yes   no subjective pain score   Pain Location Foot    Pain Orientation Left            Therex:  Walking forward/backwards/lateralin //-bars with cuing to increase hip flexion/ recip. Step pattern/ upright posture  Standing hip flexion/abduction/heel raises20xwith UE support at //-bars  Nustep L310 min. B UE/LE    Neuro.:  Berg balance test: 46/56  Ambulateingymwith use of rollator and cuing to increase step pattern (esp. R LE step length past L foot).  Resisted gait 2BTB 4x forward/ backwards.  Lateral walking 2x (limited by L forefoot pain).     Standing posture correction (mirror feedback).       PT Long Term Goals - 08/14/20 1724      PT LONG TERM GOAL #1   Title Pt. I with HEP to increase B LE muscle strength to grossly 4+/5 MMT to improve hip flexion/ gait pattern/ prevent falls.      Baseline Pt. presents with generalized B hip muscle weakness: R LE muscle strength grossly 4+/5 and L LE 4/5 MMT except B DF/hip flexion grossly 3/5 MMT (limited hip flexion during gait/ step pattern). Pt. scored a 44/56 on Berg    Time 4    Period Weeks    Status On-going    Target Date 08/31/20      PT LONG TERM GOAL #2  Title Pt. will ambulate with consistent hip flexion/ heel strike/ step pattern and length with least assistive device on level surfaces safely.    Baseline Pt. ambulates with shuffling/festinating gait pattern with poor hip flexion/ step pattern/ heel strike. Pt. has wide BOS with very small shuffling gait pattern and takes side steps rather than walking forward. Poor ability to turn without UE assist/ device in clinic. Pt. able to correct gait pattern with B UE assist and max. verbal/tactile cuing.    Time 4    Period Weeks    Status Not Met    Target Date 08/31/20      PT LONG TERM GOAL #3   Title Pt. will increase Berg balance test to >48 out of 56 to decrease fall risk/ promote safety with walkiing.    Baseline Initial Berg: 44.  1/13: 46/56    Time 4    Period Weeks    Status Partially Met    Target Date 08/31/20       PT LONG TERM GOAL #4   Title Pt. will increase FOTO to 48 to improve pain-free mobility.    Baseline Initial FOTO: 36    Time 4    Period Weeks    Status On-going    Target Date 08/31/20                 Plan - 08/14/20 1703    Clinical Impression Statement Gait pattern remains limited by short, shuffling gait pattern without use of assistive device.  Pt. requires and benefits from use of rollator to increase hip/knee flexion and consistent step pattern, esp. on R LE.  Pt. tends to shuffle more with transitions in floor surfaces/ thresholds and during turning.  Slight improvement in Berg balance test today: 46/56.    Stability/Clinical Decision Making Unstable/Unpredictable    Clinical Decision Making Moderate    Rehab Potential Fair    PT Frequency 2x / week    PT Duration 4 weeks    PT Treatment/Interventions ADLs/Self Care Home Management;Cryotherapy;Moist Heat;Balance training;Therapeutic exercise;Therapeutic activities;Manual techniques;Functional mobility training;Stair training;Gait training;Patient/family education;Neuromuscular re-education;Passive range of motion;Cognitive remediation;Energy conservation    PT Next Visit Plan Progress gait with rollator/balance and strengthening.    PT Home Exercise Plan continue walking and home program     Consulted and Agree with Plan of Care Patient;Family member/caregiver           Patient will benefit from skilled therapeutic intervention in order to improve the following deficits and impairments:  Abnormal gait,Difficulty walking,Decreased range of motion,Decreased coordination,Decreased endurance,Decreased balance,Decreased activity tolerance,Decreased mobility,Decreased strength,Obesity,Hypomobility  Visit Diagnosis: Multiple sclerosis ([place])  Gait difficulty  Muscle weakness (generalized)  Balance problem     Problem List Patient Active Problem List   Diagnosis Date Noted  . Multiple sclerosis ([place]) 01/26/2019   . Polypharmacy 03/17/2015  . History of colon polyps 03/17/2015  . HLD (hyperlipidemia) 03/17/2015  . Adult hypothyroidism 03/17/2015  . Malaise and fatigue 03/17/2015  . DS (disseminated sclerosis) ([place]) 03/17/2015  . Post menopausal syndrome 03/17/2015  . Herpes zona 03/17/2015  . Disease of thyroid gland 03/17/2015  . Avitaminosis D 03/17/2015  . Back ache 01/30/2010  . Adiposity 01/30/2010  . Acquired hypothyroidism 05/26/2009  . Allergic rhinitis 05/26/2009   Pura Spice, PT, DPT # [id number] 08/14/2020, 5:25 PM  [facility] [address] [place], [place], [address] Phone: [phone]   Fax:  [phone]  Name: Erica Noble MRN: [id number] Date  of Birth: [birth year]

## 2020-08-14 ENCOUNTER — Ambulatory Visit: Payer: Managed Care, Other (non HMO) | Admitting: Physical Therapy

## 2020-08-15 ENCOUNTER — Ambulatory Visit: Payer: Managed Care, Other (non HMO) | Admitting: Physical Therapy

## 2020-08-17 ENCOUNTER — Other Ambulatory Visit: Payer: Self-pay

## 2020-08-17 ENCOUNTER — Encounter: Payer: Self-pay | Admitting: Physical Therapy

## 2020-08-17 ENCOUNTER — Ambulatory Visit: Payer: Managed Care, Other (non HMO) | Admitting: Physical Therapy

## 2020-08-17 DIAGNOSIS — M6281 Muscle weakness (generalized): Secondary | ICD-10-CM

## 2020-08-17 DIAGNOSIS — G35 Multiple sclerosis: Secondary | ICD-10-CM | POA: Diagnosis not present

## 2020-08-17 DIAGNOSIS — R269 Unspecified abnormalities of gait and mobility: Secondary | ICD-10-CM

## 2020-08-17 DIAGNOSIS — G35D Multiple sclerosis, unspecified: Secondary | ICD-10-CM

## 2020-08-17 DIAGNOSIS — R2689 Other abnormalities of gait and mobility: Secondary | ICD-10-CM

## 2020-08-17 NOTE — Therapy (Signed)
[facility] [address] [place], [place], [address] Phone: [phone]   Fax:  [phone]  Physical Therapy Treatment  Patient Details  Name: Erica Noble MRN: [id number] Date of Birth: 01-Sep-1960 Referring Provider (PT): Dr. Jennings Books   Encounter Date: 08/17/2020   PT End of Session - 08/22/20 0940    Visit Number 9    Number of Visits 14    Date for PT Re-Evaluation 08/31/20    Authorization - Visit Number 4    Authorization - Number of Visits 10    PT Start Time 2297    PT Stop Time 1442    PT Time Calculation (min) 50 min    Equipment Utilized During Treatment Gait belt    Activity Tolerance Patient tolerated treatment well    Behavior During Therapy [facility] for tasks assessed/performed           Past Medical History:  Diagnosis Date  . Allergy 05/26/2009   Allergic rhinitis  . Basal cell carcinoma 01/17/2010   left upper lip/Moh's Dr. Lacinda Axon  . Cancer ([place])    skin ca  . History of dysplastic nevus 12/11/2007   mid back/severe  . Hyperlipidemia   . Multiple sclerosis ([place])    2008 diagnosis    Past Surgical History:  Procedure Laterality Date  . BREAST BIOPSY Right 2014   bx/clip- neg  . CHOLECYSTECTOMY  2000  . history of thyroidectomy  2008    There were no vitals filed for this visit.   Subjective Assessment - 08/22/20 0938    Subjective Pt. entered PT with assist from husband (R arm).  No falls or new complaints.  Pt. still having persistent L plantar foot pain.  PT discussed seeing a Podiatrist for L foot pain and use of more supported shoewear.  Pt. did buy new shoes at the shoe market but states they are uncomfortable.    Patient is accompained by: Family member    Pertinent History Pt. had PT 5 years ago due to balance/ gait issues. Pt. has primary chronic progressive multiple sclerosis.    Limitations Lifting;Standing;Walking    Patient Stated Goals improve LE muscle strength/ walking/ safety.     Currently in Pain? Yes    Pain Score 5     Pain Location Foot    Pain Orientation Left            Therex:  Walking forward/backwards in //-bars with cuing to increase hip flexion/ recip. pattern/ upright posture  Standing hip flexion/abduction/heel raises20xwith UE support at //-bars  Nustep L410 min. B UE/LE    Neuro.:  Standing cone taps: L/R 10x2.    Airex: L/R toe touches (1 UE assist on //-bar)/  Forward and lateral step ups (requires 1 UE assist for safety).    Ambulateingymwith use of rollator to bathroom and cuing to increase step pattern (esp. R LE step length past L foot).  Standing posture correction (mirror feedback).     PT Long Term Goals - 08/14/20 1724      PT LONG TERM GOAL #1   Title Pt. I with HEP to increase B LE muscle strength to grossly 4+/5 MMT to improve hip flexion/ gait pattern/ prevent falls.      Baseline Pt. presents with generalized B hip muscle weakness: R LE muscle strength grossly 4+/5 and L LE 4/5 MMT except B DF/hip flexion grossly 3/5 MMT (limited hip flexion during gait/ step pattern). Pt. scored a 44/56 on  Merrilee Jansky    Time 4    Period Weeks    Status On-going    Target Date 08/31/20      PT LONG TERM GOAL #2   Title Pt. will ambulate with consistent hip flexion/ heel strike/ step pattern and length with least assistive device on level surfaces safely.    Baseline Pt. ambulates with shuffling/festinating gait pattern with poor hip flexion/ step pattern/ heel strike. Pt. has wide BOS with very small shuffling gait pattern and takes side steps rather than walking forward. Poor ability to turn without UE assist/ device in clinic. Pt. able to correct gait pattern with B UE assist and max. verbal/tactile cuing.    Time 4    Period Weeks    Status Not Met    Target Date 08/31/20      PT LONG TERM GOAL #3   Title Pt. will increase Berg balance test to >48 out of 56 to decrease fall risk/ promote safety with walkiing.     Baseline Initial Berg: 44.  1/13: 46/56    Time 4    Period Weeks    Status Partially Met    Target Date 08/31/20      PT LONG TERM GOAL #4   Title Pt. will increase FOTO to 48 to improve pain-free mobility.    Baseline Initial FOTO: 36    Time 4    Period Weeks    Status On-going    Target Date 08/31/20              Plan - 08/22/20 0941    Clinical Impression Statement Pt. continues to present with shuffling pattern unless cued to correct hip flexion/ step pattern and during use of rollator.  PT instructed pt. on the importance of wearing more supportive shoes to decrease L plantar foot pain as well as use a rollator to promote greater independence/ safety with walking in clinic.  Pt. challenged by dynamic balance/ LE strengthening ex. without UE assist in //-bars.  No chnage to HEP at this time.  Pt. instructed to walk on a consistent basis at home.    Stability/Clinical Decision Making Unstable/Unpredictable    Clinical Decision Making High    Rehab Potential Fair    PT Frequency 2x / week    PT Duration 4 weeks    PT Treatment/Interventions ADLs/Self Care Home Management;Cryotherapy;Moist Heat;Balance training;Therapeutic exercise;Therapeutic activities;Manual techniques;Functional mobility training;Stair training;Gait training;Patient/family education;Neuromuscular re-education;Passive range of motion;Cognitive remediation;Energy conservation    PT Next Visit Plan Progress gait with rollator/balance and strengthening.    PT Home Exercise Plan continue walking and home program     Consulted and Agree with Plan of Care Patient;Family member/caregiver           Patient will benefit from skilled therapeutic intervention in order to improve the following deficits and impairments:  Abnormal gait,Difficulty walking,Decreased range of motion,Decreased coordination,Decreased endurance,Decreased balance,Decreased activity tolerance,Decreased mobility,Decreased  strength,Obesity,Hypomobility  Visit Diagnosis: Multiple sclerosis ([place])  Gait difficulty  Muscle weakness (generalized)  Balance problem     Problem List Patient Active Problem List   Diagnosis Date Noted  . Multiple sclerosis ([place]) 01/26/2019  . Polypharmacy 03/17/2015  . History of colon polyps 03/17/2015  . HLD (hyperlipidemia) 03/17/2015  . Adult hypothyroidism 03/17/2015  . Malaise and fatigue 03/17/2015  . DS (disseminated sclerosis) ([place]) 03/17/2015  . Post menopausal syndrome 03/17/2015  . Herpes zona 03/17/2015  . Disease of thyroid gland 03/17/2015  . Avitaminosis D 03/17/2015  . Back ache  01/30/2010  . Adiposity 01/30/2010  . Acquired hypothyroidism 05/26/2009  . Allergic rhinitis 05/26/2009   Pura Spice, PT, DPT # [phone] 08/22/2020, 9:53 AM  [facility] [address] [place], [place], [address] Phone: [phone]   Fax:  [phone]  Name: Erica Noble MRN: [id number] Date of Birth: 07/28/1961

## 2020-08-22 ENCOUNTER — Encounter: Payer: Self-pay | Admitting: Physical Therapy

## 2020-08-22 ENCOUNTER — Other Ambulatory Visit: Payer: Self-pay

## 2020-08-22 ENCOUNTER — Ambulatory Visit: Payer: Managed Care, Other (non HMO)

## 2020-08-22 DIAGNOSIS — G35 Multiple sclerosis: Secondary | ICD-10-CM

## 2020-08-22 DIAGNOSIS — R2689 Other abnormalities of gait and mobility: Secondary | ICD-10-CM

## 2020-08-22 DIAGNOSIS — G35D Multiple sclerosis, unspecified: Secondary | ICD-10-CM

## 2020-08-22 DIAGNOSIS — R269 Unspecified abnormalities of gait and mobility: Secondary | ICD-10-CM

## 2020-08-22 DIAGNOSIS — M6281 Muscle weakness (generalized): Secondary | ICD-10-CM

## 2020-08-22 NOTE — Therapy (Signed)
[facility] [address] [place], [place], [address] Phone: [phone]   Fax:  [phone]  Physical Therapy Treatment & Progress Note Reporting Period 07/06/2020 - 08/22/2020  Patient Details  Name: Erica Noble MRN: [id number] Date of Birth: Jan 21, 1961 Referring Provider (PT): Dr. Jennings Books   Encounter Date: 08/22/2020   PT End of Session - 08/22/20 1600    Visit Number 10    Number of Visits 14    Date for PT Re-Evaluation 08/31/20    Authorization - Visit Number 5    Authorization - Number of Visits 10    PT Start Time 1505    PT Stop Time 1555    PT Time Calculation (min) 50 min    Equipment Utilized During Treatment Gait belt    Activity Tolerance Patient tolerated treatment well    Behavior During Therapy [facility] for tasks assessed/performed           Past Medical History:  Diagnosis Date  . Allergy 05/26/2009   Allergic rhinitis  . Basal cell carcinoma 01/17/2010   left upper lip/Moh's Dr. Lacinda Axon  . Cancer ([place])    skin ca  . History of dysplastic nevus 12/11/2007   mid back/severe  . Hyperlipidemia   . Multiple sclerosis ([place])    2008 diagnosis    Past Surgical History:  Procedure Laterality Date  . BREAST BIOPSY Right 2014   bx/clip- neg  . CHOLECYSTECTOMY  2000  . history of thyroidectomy  2008    There were no vitals filed for this visit.   Subjective Assessment - 08/22/20 1516    Subjective Pt presents today without her husband with a 3 wheeled walker. Pt reports no falls, but has felt off balance. Pt states shes been having increased pain in the arch of her L foot which makes walking and standing more difficult. Pt states she feels more confident on her feet since starting therapy.    Pertinent History Pt. had PT 5 years ago due to balance/ gait issues. Pt. has primary chronic progressive multiple sclerosis.    Limitations Lifting;Standing;Walking    How long can you stand comfortably? 15-20  minutes    How long can you walk comfortably? 30 minutes - with AD    Patient Stated Goals improve LE muscle strength/ walking/ safety.    Currently in Pain? Yes    Pain Score 5     Pain Location Foot    Pain Orientation Left          Therex: Nustep L410 min. B UE/LE - seat 7   Seated march x1', seated HS/TR x 1 minute   (goals reassessed this session as well)  Neuro:  // bars with gait belt and CGA and mirror feedback: amb with increased step length 4 laps , side stepping 4 laps, NBoS balance 1 minute, tandem balance 1 minute each, hip flex/abd/ext x10 each leg    Ambulateingymwith use of rollator and cuing to increase step pattern (esp. R LE step length past L foot).  Standing posture correction (mirror feedback).  MMT: hip flexion L 4-/5, R 4+/5 Knee extension B 5/5 Knee flexion B 5/5         PT Long Term Goals - 08/22/20 1513      PT LONG TERM GOAL #1   Title Pt. I with HEP to increase B LE muscle strength to grossly 4+/5 MMT to improve hip flexion/ gait pattern/ prevent falls.  Baseline Pt. presents with generalized B hip muscle weakness: R LE muscle strength grossly 4+/5 and L LE 4/5 MMT except B DF/hip flexion grossly 3/5 MMT (limited hip flexion during gait/ step pattern); 1/25 : MMT: hip flexion L 4-/5, R 4+/5  Knee extension B 5/5  Knee flexion B 5/5    Time 4    Period Weeks    Status On-going    Target Date 08/31/20      PT LONG TERM GOAL #2   Title Pt. will ambulate with consistent hip flexion/ heel strike/ step pattern and length with least assistive device on level surfaces safely.    Baseline Pt. ambulates with shuffling/festinating gait pattern with poor hip flexion/ step pattern/ heel strike. Pt. has wide BOS with very small shuffling gait pattern and takes side steps rather than walking forward. Poor ability to turn without UE assist/ device in clinic. Pt. able to correct gait pattern with B UE assist and max. verbal/tactile cuing. 1/25: Pt  continues to ambulate with a shuffling/festinating gait pattern with poor fip flexion, short step length and short stance phase. She has a wide BoS. She requires frequent verbal cues to improve step length and pt is able to correct for a 5-10 steps before shuffling again.    Time 4    Period Weeks    Target Date 08/31/20      PT LONG TERM GOAL #3   Title Pt. will increase Berg balance test to >48 out of 56 to decrease fall risk/ promote safety with walkiing.    Baseline Initial Berg: 44.  1/13: 46/56 ; 1/17 44/56    Time 4    Period Weeks    Status On-going    Target Date 08/31/20      PT LONG TERM GOAL #4   Title Pt. will increase FOTO to 48 to improve pain-free mobility.    Baseline Initial FOTO: 36    Time 3    Period Weeks    Status On-going    Target Date 08/31/20                 Plan - 08/22/20 1610    Clinical Impression Statement Pt presents with increases in B hip flexion strength since starting therapy. However, pt continues to demo poor gait mechanics as noted in updated goals and resulting in need for an assistive device or hand held assistance from husband during ADLs. Pt requires frequent verbral cues for improved gait mechanics and pt is able to correct her self for 5-10 steps. Pt requires UE support, gait belt, and CGA for all standing dynamic exercises, though pt is able to hold NBoS and tandem stance for 1 minute, no UE support and no sway or LoB. the patient would benefit from further skilled PT intervention to progress towards goals, maximize safety as well as independence. Pt did mention that due to finaces, she may want her next visit to be her last visit.    Stability/Clinical Decision Making Unstable/Unpredictable    Clinical Decision Making High    Rehab Potential Fair    PT Frequency 2x / week    PT Duration 4 weeks    PT Treatment/Interventions ADLs/Self Care Home Management;Cryotherapy;Moist Heat;Balance training;Therapeutic exercise;Therapeutic  activities;Manual techniques;Functional mobility training;Stair training;Gait training;Patient/family education;Neuromuscular re-education;Passive range of motion;Cognitive remediation;Energy conservation    PT Next Visit Plan Progress gait with rollator/balance and strengthening.    PT Home Exercise Plan continue walking and home program     Consulted and  Agree with Plan of Care Patient           Patient will benefit from skilled therapeutic intervention in order to improve the following deficits and impairments:  Abnormal gait,Difficulty walking,Decreased range of motion,Decreased coordination,Decreased endurance,Decreased balance,Decreased activity tolerance,Decreased mobility,Decreased strength,Obesity,Hypomobility  Visit Diagnosis: Multiple sclerosis (HCC)  Gait difficulty  Muscle weakness (generalized)  Balance problem     Problem List Patient Active Problem List   Diagnosis Date Noted  . Multiple sclerosis ([place]) 01/26/2019  . Polypharmacy 03/17/2015  . History of colon polyps 03/17/2015  . HLD (hyperlipidemia) 03/17/2015  . Adult hypothyroidism 03/17/2015  . Malaise and fatigue 03/17/2015  . DS (disseminated sclerosis) ([place]) 03/17/2015  . Post menopausal syndrome 03/17/2015  . Herpes zona 03/17/2015  . Disease of thyroid gland 03/17/2015  . Avitaminosis D 03/17/2015  . Back ache 01/30/2010  . Adiposity 01/30/2010  . Acquired hypothyroidism 05/26/2009  . Allergic rhinitis 05/26/2009   Kayleen Memos SPT    [facility] [address] [place], [place], [address] Phone: [phone]   Fax:  [phone]  Name: Erica Noble MRN: [id number] Date of Birth: 1961/02/27

## 2020-08-24 ENCOUNTER — Other Ambulatory Visit: Payer: Self-pay

## 2020-08-24 ENCOUNTER — Ambulatory Visit: Payer: Managed Care, Other (non HMO) | Admitting: Physical Therapy

## 2020-08-24 DIAGNOSIS — G35 Multiple sclerosis: Secondary | ICD-10-CM

## 2020-08-24 DIAGNOSIS — R2689 Other abnormalities of gait and mobility: Secondary | ICD-10-CM

## 2020-08-24 DIAGNOSIS — M6281 Muscle weakness (generalized): Secondary | ICD-10-CM

## 2020-08-24 DIAGNOSIS — R269 Unspecified abnormalities of gait and mobility: Secondary | ICD-10-CM

## 2020-08-25 ENCOUNTER — Encounter: Payer: Self-pay | Admitting: Physical Therapy

## 2020-08-26 NOTE — Therapy (Signed)
[facility] [address] [place], [place], [address] Phone: [phone]   Fax:  [phone]  Physical Therapy Treatment  Patient Details  Name: Erica Noble MRN: [id number] Date of Birth: 1960-08-11 Referring Provider (PT): Dr. Jennings Books   Encounter Date: 08/24/2020   PT End of Session - 08/26/20 1354    Visit Number 11    Number of Visits 14    Date for PT Re-Evaluation 08/31/20    Authorization - Visit Number 6    Authorization - Number of Visits 10    PT Start Time 6283    PT Stop Time 1438    PT Time Calculation (min) 51 min    Equipment Utilized During Treatment Gait belt    Activity Tolerance Patient tolerated treatment well    Behavior During Therapy [facility] for tasks assessed/performed           Past Medical History:  Diagnosis Date  . Allergy 05/26/2009   Allergic rhinitis  . Basal cell carcinoma 01/17/2010   left upper lip/Moh's Dr. Lacinda Axon  . Cancer ([place])    skin ca  . History of dysplastic nevus 12/11/2007   mid back/severe  . Hyperlipidemia   . Multiple sclerosis ([place])    2008 diagnosis    Past Surgical History:  Procedure Laterality Date  . BREAST BIOPSY Right 2014   bx/clip- neg  . CHOLECYSTECTOMY  2000  . history of thyroidectomy  2008    There were no vitals filed for this visit.   Subjective Assessment - 08/26/20 1342    Subjective Pt. had f/u with MD since last tx. session.  No change in meds or POC.  Pt. still planning to make appt. with Podiatrist to discuss L plantar foot pain/ orthotics.    Pertinent History Pt. had PT 5 years ago due to balance/ gait issues. Pt. has primary chronic progressive multiple sclerosis.    Limitations Lifting;Standing;Walking    How long can you stand comfortably? 15-20 minutes    How long can you walk comfortably? 30 minutes - with AD    Patient Stated Goals improve LE muscle strength/ walking/ safety.    Currently in Pain? Yes    Pain Score 5     Pain  Location Foot    Pain Orientation Left              [facility] PT Assessment - 08/26/20 0001      Assessment   Medical Diagnosis Multiple Sclerosis/ Gait Difficulty    Referring Provider (PT) Dr. Jennings Books    Onset Date/Surgical Date 02/27/07      Prior Function   Level of Independence Independent with basic ADLs             Therex:  Nustep L410 min. B UE/LE - seat 7   Seated march x1', seated HS/TR x 1 minute   Standing hip flexion/ abduction 20x each in //-bars.  Discussed HEP/ importance of daily walking program.   Neuro:   // bars with gait belt and CGA and mirror feedback: amb with increased step length 4 laps , side stepping 4 laps, NBoS balance 1 minute, tandem balance 1 minute each, hip flex/abd/ext x10 each leg    Ambulateingymwith use of rollator and cuing to increase step pattern (esp. R LE step length past L foot).  Standing posture correction (mirror feedback).  Airex: step ups/ wt. Shifting/ step downs.    PT Long Term Goals - 08/26/20 1357  PT LONG TERM GOAL #1   Title Pt. I with HEP to increase B LE muscle strength to grossly 4+/5 MMT to improve hip flexion/ gait pattern/ prevent falls.      Baseline Pt. presents with generalized B hip muscle weakness: R LE muscle strength grossly 4+/5 and L LE 4/5 MMT except B DF/hip flexion grossly 3/5 MMT (limited hip flexion during gait/ step pattern); 1/25 : MMT: hip flexion L 4-/5, R 4+/5  Knee extension B 5/5  Knee flexion B 5/5    Time 4    Period Weeks    Status Partially Met    Target Date 08/24/20      PT LONG TERM GOAL #2   Title Pt. will ambulate with consistent hip flexion/ heel strike/ step pattern and length with least assistive device on level surfaces safely.    Baseline Pt. ambulates with shuffling/festinating gait pattern with poor hip flexion/ step pattern/ heel strike. Pt. has wide BOS with very small shuffling gait pattern and takes side steps rather than walking forward.  Poor ability to turn without UE assist/ device in clinic. Pt. able to correct gait pattern with B UE assist and max. verbal/tactile cuing. 1/25: Pt continues to ambulate with a shuffling/festinating gait pattern with poor fip flexion, short step length and short stance phase. She has a wide BoS. She requires frequent verbal cues to improve step length and pt is able to correct for a 5-10 steps before shuffling again.    Time 4    Period Weeks    Status Not Met    Target Date 08/24/20      PT LONG TERM GOAL #3   Title Pt. will increase Berg balance test to >48 out of 56 to decrease fall risk/ promote safety with walkiing.    Baseline Initial Berg: 44.  1/13: 46/56 ; 1/17 44/56    Time 4    Period Weeks    Status Not Met    Target Date 08/24/20      PT LONG TERM GOAL #4   Title Pt. will increase FOTO to 48 to improve pain-free mobility.    Baseline Initial FOTO: 36    Time 3    Period Weeks    Status Not Met    Target Date 08/24/20                 Plan - 08/26/20 1355    Clinical Impression Statement See updated goals.  Pt. continues to present with shuffling pattern unless cued to correct hip flexion/ step pattern and during use of rollator. PT instructed pt. on the importance of wearing more supportive shoes to decrease L plantar foot pain as well as use a rollator to promote greater independence/ safety with walking in clinic. Pt. remains challenged by dynamic balance/ LE strengthening ex. without UE assist in //-bars.  Discharge from skilled PT at this time.  Pt. instructed to contact PT if any regression in symptoms or questions.    Stability/Clinical Decision Making Unstable/Unpredictable    Clinical Decision Making High    Rehab Potential Fair    PT Frequency 2x / week    PT Duration 4 weeks    PT Treatment/Interventions ADLs/Self Care Home Management;Cryotherapy;Moist Heat;Balance training;Therapeutic exercise;Therapeutic activities;Manual techniques;Functional mobility  training;Stair training;Gait training;Patient/family education;Neuromuscular re-education;Passive range of motion;Cognitive remediation;Energy conservation    PT Next Visit Plan Discharge from PT at this time.    PT Home Exercise Plan continue walking and home program  Consulted and Agree with Plan of Care Patient           Patient will benefit from skilled therapeutic intervention in order to improve the following deficits and impairments:  Abnormal gait,Difficulty walking,Decreased range of motion,Decreased coordination,Decreased endurance,Decreased balance,Decreased activity tolerance,Decreased mobility,Decreased strength,Obesity,Hypomobility  Visit Diagnosis: Multiple sclerosis (HCC)  Gait difficulty  Muscle weakness (generalized)  Balance problem     Problem List Patient Active Problem List   Diagnosis Date Noted  . Multiple sclerosis ([place]) 01/26/2019  . Polypharmacy 03/17/2015  . History of colon polyps 03/17/2015  . HLD (hyperlipidemia) 03/17/2015  . Adult hypothyroidism 03/17/2015  . Malaise and fatigue 03/17/2015  . DS (disseminated sclerosis) ([place]) 03/17/2015  . Post menopausal syndrome 03/17/2015  . Herpes zona 03/17/2015  . Disease of thyroid gland 03/17/2015  . Avitaminosis D 03/17/2015  . Back ache 01/30/2010  . Adiposity 01/30/2010  . Acquired hypothyroidism 05/26/2009  . Allergic rhinitis 05/26/2009   Pura Spice, PT, DPT # [id number] 08/26/2020, 2:00 PM  [facility] [address] [place], [place], [address] Phone: [phone]   Fax:  [phone]  Name: Erica Noble MRN: [id number] Date of Birth: 07-05-1961

## 2020-10-05 ENCOUNTER — Encounter: Payer: Managed Care, Other (non HMO) | Admitting: Family Medicine

## 2020-10-12 ENCOUNTER — Other Ambulatory Visit: Payer: Self-pay

## 2020-10-12 ENCOUNTER — Ambulatory Visit (INDEPENDENT_AMBULATORY_CARE_PROVIDER_SITE_OTHER): Payer: Managed Care, Other (non HMO) | Admitting: Family Medicine

## 2020-10-12 ENCOUNTER — Encounter: Payer: Self-pay | Admitting: Family Medicine

## 2020-10-12 VITALS — BP 117/80 | HR 64 | Temp 98.2°F | Resp 16

## 2020-10-12 DIAGNOSIS — Z0000 Encounter for general adult medical examination without abnormal findings: Secondary | ICD-10-CM | POA: Diagnosis not present

## 2020-10-12 DIAGNOSIS — E782 Mixed hyperlipidemia: Secondary | ICD-10-CM | POA: Diagnosis not present

## 2020-10-12 DIAGNOSIS — F411 Generalized anxiety disorder: Secondary | ICD-10-CM

## 2020-10-12 DIAGNOSIS — E039 Hypothyroidism, unspecified: Secondary | ICD-10-CM

## 2020-10-12 DIAGNOSIS — G35 Multiple sclerosis: Secondary | ICD-10-CM | POA: Diagnosis not present

## 2020-10-12 DIAGNOSIS — Z289 Immunization not carried out for unspecified reason: Secondary | ICD-10-CM

## 2020-10-12 MED ORDER — SHINGRIX 50 MCG/0.5ML IM SUSR: 0.5000 mL | Freq: Once | INTRAMUSCULAR | 0 refills | Status: AC

## 2020-10-12 NOTE — Progress Notes (Signed)
I,April Miller,acting as a scribe for Wilhemena Durie, MD.,have documented all relevant documentation on the behalf of Wilhemena Durie, MD,as directed by  Wilhemena Durie, MD while in the presence of Wilhemena Durie, MD.  Complete physical exam   Patient: Erica Noble   DOB: 08-Nov-1960   60 y.o. Female  MRN: [id number] Visit Date: 10/12/2020  Today's healthcare provider: Wilhemena Durie, MD   Chief Complaint  Patient presents with  . Annual Exam   Subjective    Erica Noble is a 60 y.o. female who presents today for a complete physical exam.  She reports consuming a general diet. The patient does not participate in regular exercise at present. She generally feels fairly well. She reports sleeping fairly well. She does have additional problems to discuss today.  HPI  Sees Dr. Leafy Ro from gynecology for well woman exam Dr. Matilde Haymaker from dermatology Dr. Nehemiah Massed from cardiology and she sees podiatry. She has had COVID booster and she is advised to going get the shingles shot.  Past Medical History:  Diagnosis Date  . Allergy 05/26/2009   Allergic rhinitis  . Basal cell carcinoma 01/17/2010   left upper lip/Moh's Dr. Lacinda Axon  . Cancer ([place])    skin ca  . History of dysplastic nevus 12/11/2007   mid back/severe  . Hyperlipidemia   . Multiple sclerosis ([place])    2008 diagnosis   Past Surgical History:  Procedure Laterality Date  . BREAST BIOPSY Right 2014   bx/clip- neg  . CHOLECYSTECTOMY  2000  . history of thyroidectomy  2008   Social History   Socioeconomic History  . Marital status: Married    Spouse name: Not on file  . Number of children: Not on file  . Years of education: Not on file  . Highest education level: Not on file  Occupational History  . Not on file  Tobacco Use  . Smoking status: Former Smoker    Quit date: 07/29/1986    Years since quitting: 34.2  . Smokeless tobacco: Never Used  Substance and Sexual Activity  . Alcohol use: No   . Drug use: No  . Sexual activity: Not on file  Other Topics Concern  . Not on file  Social History Narrative  . Not on file   Social Determinants of Health   Financial Resource Strain: Not on file  Food Insecurity: Not on file  Transportation Needs: Not on file  Physical Activity: Not on file  Stress: Not on file  Social Connections: Not on file  Intimate Partner Violence: Not on file   Family Status  Relation Name Status  . Neg Hx  (Not Specified)   Family History  Adopted: Yes  Problem Relation Age of Onset  . Breast cancer Neg Hx    No Known Allergies  Patient Care Team: Jerrol Banana., MD as PCP - General (Family Medicine)   Medications: Outpatient Medications Prior to Visit  Medication Sig  . levothyroxine (SYNTHROID) 125 MCG tablet Take 1 tablet (125 mcg total) by mouth daily before breakfast.  . mupirocin ointment (BACTROBAN) 2 % Apply 1 application topically daily. (Patient not taking: Reported on 10/12/2020)   No facility-administered medications prior to visit.    Review of Systems  Constitutional: Positive for fatigue.  HENT: Positive for sneezing and sore throat.   Gastrointestinal: Positive for constipation.  Endocrine: Positive for heat intolerance.  Musculoskeletal: Positive for gait problem and joint swelling.  Neurological: Positive  for dizziness.  All other systems reviewed and are negative.      Objective    BP 117/80 (BP Location: Right Arm, Patient Position: Sitting, Cuff Size: Large)   Pulse 64   Temp 98.2 F (36.8 C) (Oral)   Resp 16   SpO2 97%     Physical Exam Vitals reviewed.  Constitutional:      Appearance: Normal appearance. She is obese.  HENT:     Head: Normocephalic and atraumatic.     Right Ear: Tympanic membrane, ear canal and external ear normal.     Left Ear: Tympanic membrane, ear canal and external ear normal.     Nose: Nose normal.     Mouth/Throat:     Pharynx: Oropharynx is clear.  Eyes:      General: No scleral icterus.    Conjunctiva/sclera: Conjunctivae normal.  Cardiovascular:     Rate and Rhythm: Normal rate and regular rhythm.     Pulses: Normal pulses.     Heart sounds: Normal heart sounds.  Pulmonary:     Effort: Pulmonary effort is normal.     Breath sounds: Normal breath sounds.  Abdominal:     Palpations: Abdomen is soft.  Musculoskeletal:        General: No swelling.     Right lower leg: No edema.     Left lower leg: No edema.  Lymphadenopathy:     Cervical: No cervical adenopathy.  Skin:    General: Skin is warm and dry.  Neurological:     Mental Status: She is alert and oriented to person, place, and time. Mental status is at baseline.     Coordination: Coordination abnormal.     Comments: Progressive weakness from MS.  Psychiatric:        Mood and Affect: Mood normal.        Behavior: Behavior normal.        Thought Content: Thought content normal.        Judgment: Judgment normal.       Last depression screening scores PHQ 2/9 Scores 10/12/2020 05/03/2020 01/25/2019  PHQ - 2 Score 3 2 2   PHQ- 9 Score 12 8 14    Last fall risk screening Fall Risk  05/03/2020  Falls in the past year? 1  Number falls in past yr: 1  Injury with Fall? 0  Risk for fall due to : Impaired balance/gait;Impaired mobility  Follow up Falls evaluation completed   Last Audit-C alcohol use screening Alcohol Use Disorder Test (AUDIT) 10/12/2020  1. How often do you have a drink containing alcohol? 0  2. How many drinks containing alcohol do you have on a typical day when you are drinking? 0  3. How often do you have six or more drinks on one occasion? 0  AUDIT-C Score 0  Alcohol Brief Interventions/Follow-up AUDIT Score <7 follow-up not indicated   A score of 3 or more in women, and 4 or more in men indicates increased risk for alcohol abuse, EXCEPT if all of the points are from question 1   No results found for any visits on 10/12/20.  Assessment & Plan    Routine  Health Maintenance and Physical Exam  Exercise Activities and Dietary recommendations Goals   None     Immunization History  Administered Date(s) Administered  . Influenza,inj,Quad PF,6+ Mos 04/28/2019, 05/03/2020  . Influenza-Unspecified 04/18/2019, 04/28/2019  . PFIZER(Purple Top)SARS-COV-2 Vaccination 10/24/2019, 11/17/2019  . Tdap 12/30/2012    Health Maintenance  Topic Date Due  .  HIV Screening  Never done  . COVID-19 Vaccine (4 - Booster for [place] series) 09/23/2020  . COLONOSCOPY (Pts 45-19yrs Insurance coverage will need to be confirmed)  11/07/2021  . MAMMOGRAM  03/30/2022  . PAP SMEAR-Modifier  08/20/2022  . TETANUS/TDAP  12/31/2022  . INFLUENZA VACCINE  Completed  . Hepatitis C Screening  Completed  . HPV VACCINES  Aged Out    Discussed health benefits of physical activity, and encouraged her to engage in regular exercise appropriate for her age and condition.  1. Annual physical exam Well woman exam per Dr. Leafy Ro Colonoscopy supposed to be repeated in 2023 per GI - Lipid panel - TSH - CBC w/Diff/Platelet - Comprehensive Metabolic Panel (CMET)  2. Mixed hyperlipidemia   3. Adult hypothyroidism   4. Multiple sclerosis [facility]) Per neurology  5. GAD (generalized anxiety disorder)  - POCT urinalysis dipstick  6. Prescription for Shingrix. Vaccine not administered in office.   - Zoster Vaccine Adjuvanted [facility]) injection; Inject 0.5 mLs into the muscle once for 1 dose.  Dispense: 0.5 mL; Refill: 0   No follow-ups on file.     I, Wilhemena Durie, MD, have reviewed all documentation for this visit. The documentation on 10/15/20 for the exam, diagnosis, procedures, and orders are all accurate and complete.    Wendi Lastra Cranford Mon, MD  [facility] [phone] (phone) [phone] (fax)  [place]

## 2020-10-21 LAB — CBC WITH DIFFERENTIAL/PLATELET
Basophils Absolute: 0 10*3/uL (ref 0.0–0.2)
Basos: 1 %
EOS (ABSOLUTE): 0.1 10*3/uL (ref 0.0–0.4)
Eos: 1 %
Hematocrit: 41.8 % (ref 34.0–46.6)
Hemoglobin: 14.3 g/dL (ref 11.1–15.9)
Immature Grans (Abs): 0 10*3/uL (ref 0.0–0.1)
Immature Granulocytes: 0 %
Lymphocytes Absolute: 1.9 10*3/uL (ref 0.7–3.1)
Lymphs: 30 %
MCH: 32.3 pg (ref 26.6–33.0)
MCHC: 34.2 g/dL (ref 31.5–35.7)
MCV: 94 fL (ref 79–97)
Monocytes Absolute: 0.6 10*3/uL (ref 0.1–0.9)
Monocytes: 9 %
Neutrophils Absolute: 3.6 10*3/uL (ref 1.4–7.0)
Neutrophils: 59 %
Platelets: 210 10*3/uL (ref 150–450)
RBC: 4.43 x10E6/uL (ref 3.77–5.28)
RDW: 12.3 % (ref 11.7–15.4)
WBC: 6.2 10*3/uL (ref 3.4–10.8)

## 2020-10-21 LAB — COMPREHENSIVE METABOLIC PANEL
ALT: 16 IU/L (ref 0–32)
AST: 17 IU/L (ref 0–40)
Albumin/Globulin Ratio: 1.8 (ref 1.2–2.2)
Albumin: 4.3 g/dL (ref 3.8–4.9)
Alkaline Phosphatase: 68 IU/L (ref 44–121)
BUN/Creatinine Ratio: 13 (ref 9–23)
BUN: 11 mg/dL (ref 6–24)
Bilirubin Total: 0.5 mg/dL (ref 0.0–1.2)
CO2: 23 mmol/L (ref 20–29)
Calcium: 9.2 mg/dL (ref 8.7–10.2)
Chloride: 104 mmol/L (ref 96–106)
Creatinine, Ser: 0.84 mg/dL (ref 0.57–1.00)
Globulin, Total: 2.4 g/dL (ref 1.5–4.5)
Glucose: 122 mg/dL — ABNORMAL HIGH (ref 65–99)
Potassium: 4.5 mmol/L (ref 3.5–5.2)
Sodium: 142 mmol/L (ref 134–144)
Total Protein: 6.7 g/dL (ref 6.0–8.5)
eGFR: 80 mL/min/{1.73_m2} (ref >59)

## 2020-10-21 LAB — LIPID PANEL
Chol/HDL Ratio: 3.1 ratio (ref 0.0–4.4)
Cholesterol, Total: 183 mg/dL (ref 100–199)
HDL: 59 mg/dL (ref >39)
LDL Chol Calc (NIH): 109 mg/dL — ABNORMAL HIGH (ref 0–99)
Triglycerides: 82 mg/dL (ref 0–149)
VLDL Cholesterol Cal: 15 mg/dL (ref 5–40)

## 2020-10-21 LAB — TSH: TSH: 2 u[IU]/mL (ref 0.450–4.500)

## 2020-10-25 ENCOUNTER — Other Ambulatory Visit: Payer: Self-pay | Admitting: *Deleted

## 2020-10-25 DIAGNOSIS — E039 Hypothyroidism, unspecified: Secondary | ICD-10-CM

## 2020-10-25 MED ORDER — LEVOTHYROXINE SODIUM 150 MCG PO TABS: 150.0000 ug | ORAL_TABLET | Freq: Every day | ORAL | 3 refills | Status: DC

## 2021-02-08 ENCOUNTER — Encounter: Payer: Self-pay | Admitting: Dermatology

## 2021-02-20 ENCOUNTER — Other Ambulatory Visit: Payer: Self-pay | Admitting: Obstetrics and Gynecology

## 2021-03-07 ENCOUNTER — Ambulatory Visit: Payer: Managed Care, Other (non HMO) | Admitting: Family Medicine

## 2021-03-22 ENCOUNTER — Encounter: Payer: Self-pay | Admitting: Family Medicine

## 2021-03-22 ENCOUNTER — Ambulatory Visit: Payer: Managed Care, Other (non HMO) | Admitting: Family Medicine

## 2021-03-22 ENCOUNTER — Other Ambulatory Visit: Payer: Self-pay

## 2021-03-22 VITALS — BP 118/74 | HR 74 | Temp 97.5°F | Wt 229.0 lb

## 2021-03-22 DIAGNOSIS — Z6835 Body mass index (BMI) 35.0-35.9, adult: Secondary | ICD-10-CM

## 2021-03-22 DIAGNOSIS — E039 Hypothyroidism, unspecified: Secondary | ICD-10-CM

## 2021-03-22 DIAGNOSIS — F411 Generalized anxiety disorder: Secondary | ICD-10-CM

## 2021-03-22 DIAGNOSIS — F32A Depression, unspecified: Secondary | ICD-10-CM

## 2021-03-22 MED ORDER — SERTRALINE HCL 50 MG PO TABS: 50.0000 mg | ORAL_TABLET | Freq: Every day | ORAL | 5 refills | Status: DC

## 2021-03-22 NOTE — Progress Notes (Signed)
Established patient visit   Patient: Erica Noble   DOB: July 10, 1961   60 y.o. Female  MRN: [id number] Visit Date: 03/22/2021  Today's healthcare provider: Wilhemena Durie, MD   Chief Complaint  Patient presents with   Hypothyroidism    Subjective  -------------------------------------------------------------------------------------------------------------------- HPI  She comes in today for follow-up.  She needs her thyroid rechecked.  Her MS is since slow continuing decline.  He admits to being depressed but is not suicidal or homicidal.  She is very reluctant to have treatment. Hypothyroid, follow-up  Lab Results  Component Value Date   TSH 2.000 10/20/2020   TSH 6.100 (H) 05/03/2020   TSH 4.210 04/28/2019   Wt Readings from Last 3 Encounters:  03/22/21 229 lb (103.9 kg)  05/03/20 239 lb (108.4 kg)  04/28/19 220 lb (99.8 kg)    She was last seen for hypothyroid 4 months ago.  Management since that visit includes increasing levothyroxine from 145mg to 1543m daily. She reports excellent compliance with treatment. She is not having side effects.   Symptoms: No change in energy level Yes constipation  No diarrhea Yes heat / cold intolerance  No nervousness No palpitations  No weight changes    -----------------------------------------------------------------------------------------      Medications: Outpatient Medications Prior to Visit  Medication Sig   levothyroxine (SYNTHROID) 150 MCG tablet Take 1 tablet (150 mcg total) by mouth daily before breakfast.   mupirocin ointment (BACTROBAN) 2 % Apply 1 application topically daily. (Patient not taking: Reported on 03/22/2021)   No facility-administered medications prior to visit.    Review of Systems  Constitutional: Negative.   Respiratory: Negative.    Cardiovascular:  Positive for leg swelling. Negative for chest pain and palpitations.  Gastrointestinal:  Positive for constipation. Negative for  abdominal distention, abdominal pain, anal bleeding, blood in stool, diarrhea, nausea, rectal pain and vomiting.  Endocrine: Positive for heat intolerance. Negative for cold intolerance, polydipsia, polyphagia and polyuria.  Neurological:  Positive for headaches. Negative for dizziness and light-headedness.      Objective  -------------------------------------------------------------------------------------------------------------------- BP 118/74 (BP Location: Right Arm, Patient Position: Sitting, Cuff Size: Large)   Pulse 74   Temp (!) 97.5 F (36.4 C) (Oral)   Wt 229 lb (103.9 kg)   SpO2 97%   BMI 38.11 kg/m     Physical Exam Vitals reviewed.  Constitutional:      Appearance: Normal appearance. She is obese.  HENT:     Head: Normocephalic and atraumatic.     Right Ear: Tympanic membrane, ear canal and external ear normal.     Left Ear: Tympanic membrane, ear canal and external ear normal.     Nose: Nose normal.     Mouth/Throat:     Pharynx: Oropharynx is clear.  Eyes:     General: No scleral icterus. Cardiovascular:     Rate and Rhythm: Normal rate and regular rhythm.     Pulses: Normal pulses.     Heart sounds: Normal heart sounds.  Pulmonary:     Effort: Pulmonary effort is normal.     Breath sounds: Normal breath sounds.  Abdominal:     Palpations: Abdomen is soft.  Musculoskeletal:        General: No swelling.     Right lower leg: No edema.     Left lower leg: No edema.  Lymphadenopathy:     Cervical: No cervical adenopathy.  Skin:    General: Skin is warm and dry.  Neurological:  Mental Status: She is alert and oriented to person, place, and time. Mental status is at baseline.     Coordination: Coordination abnormal.     Comments: Progressive weakness from MS.  Psychiatric:        Mood and Affect: Mood normal.        Behavior: Behavior normal.        Thought Content: Thought content normal.        Judgment: Judgment normal.       No results  found for any visits on 03/22/21.  Assessment & Plan  ---------------------------------------------------------------------------------------------------------------------- 1. Adult hypothyroidism  - TSH  2. GAD (generalized anxiety disorder)   3. Depression, unspecified depression type I think patient should be treated but she declines after agreeing to treatment with me.  She is not suicidal or homicidal but I think she could benefit from an antidepressant.  Counseling may be appropriate also at any point in time. - sertraline (ZOLOFT) 50 MG tablet; Take 1 tablet (50 mg total) by mouth daily.  Dispense: 30 tablet; Refill: 5  4. Class 2 severe obesity due to excess calories with serious comorbidity and body mass index (BMI) of 35.0 to 35.9 in adult [facility]) Loss would help her with her MS symptoms some. Follow-up TSH for euthyroid state  No follow-ups on file.      I, Wilhemena Durie, MD, have reviewed all documentation for this visit. The documentation on 03/30/21 for the exam, diagnosis, procedures, and orders are all accurate and complete.    Kenny Rea Cranford Mon, MD  [facility] [phone] (phone) [phone] (fax)  [place]

## 2021-03-25 ENCOUNTER — Other Ambulatory Visit: Payer: Self-pay | Admitting: Family Medicine

## 2021-03-25 DIAGNOSIS — F32A Depression, unspecified: Secondary | ICD-10-CM

## 2021-03-25 NOTE — Telephone Encounter (Signed)
Requested medication (s) are due for refill today: no  Requested medication (s) are on the active medication list: yes  Last refill:  03/22/21 #30 5 RF  Future visit scheduled: yes  Notes to clinic:  pt requesting 90 day refills -    Requested Prescriptions  Pending Prescriptions Disp Refills   sertraline (ZOLOFT) 50 MG tablet [Pharmacy Med Name: SERTRALINE HCL 50 MG TABLET] 90 tablet 2    Sig: TAKE 1 TABLET BY [place]     Psychiatry:  Antidepressants - SSRI Passed - 03/25/2021  2:29 PM      Passed - Valid encounter within last 6 months    Recent Outpatient Visits           3 days ago Adult hypothyroidism   [facility] Jerrol Banana., MD   5 months ago Annual physical exam   [facility] Jerrol Banana., MD   10 months ago Acquired hypothyroidism   [facility] Jerrol Banana., MD   1 year ago Adult hypothyroidism   [facility] Jerrol Banana., MD   2 years ago Adult hypothyroidism   [facility] Jerrol Banana., MD       Future Appointments             In 6 months Jerrol Banana., MD [facility] [place]

## 2021-03-26 IMAGING — MG DIGITAL SCREENING BILAT W/ TOMO W/ CAD
8 series · 8 of 24 positions shown · non-contrast
Comparison: Previous exam(s).

ACR Breast Density Category a: The breast tissue is almost entirely
fatty.

CLINICAL DATA: Screening.

EXAM:
DIGITAL SCREENING BILATERAL MAMMOGRAM WITH TOMO AND CAD

[L CC synth-2D]
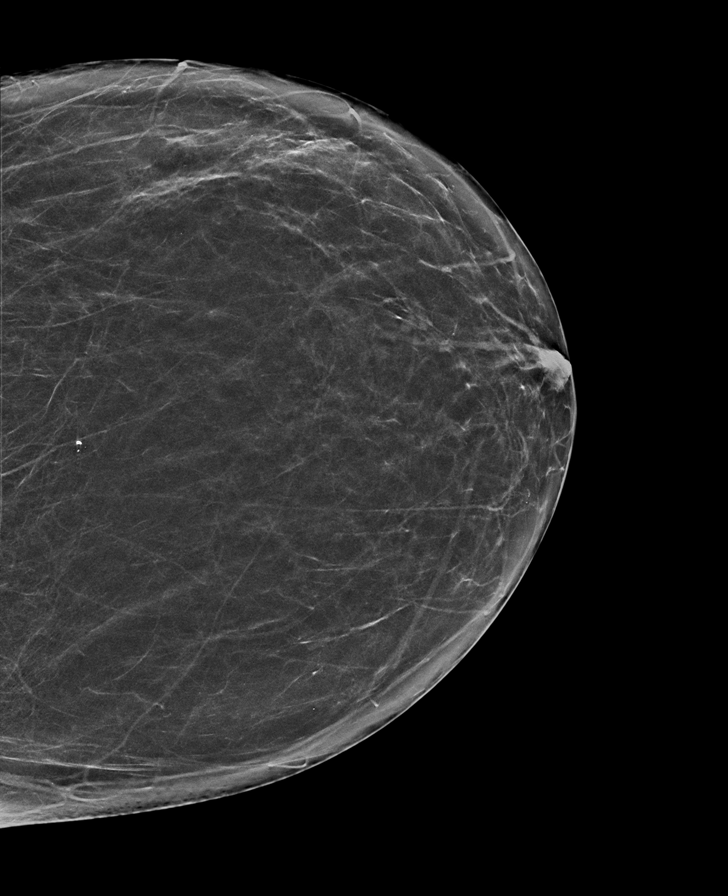

[R MLO synth-2D]
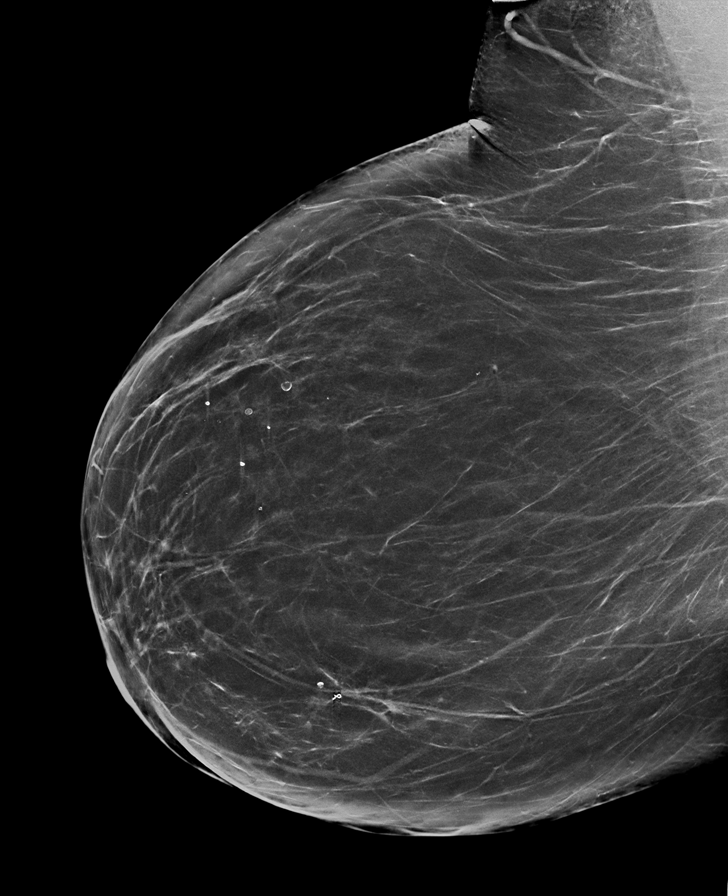

[R CC synth-2D]
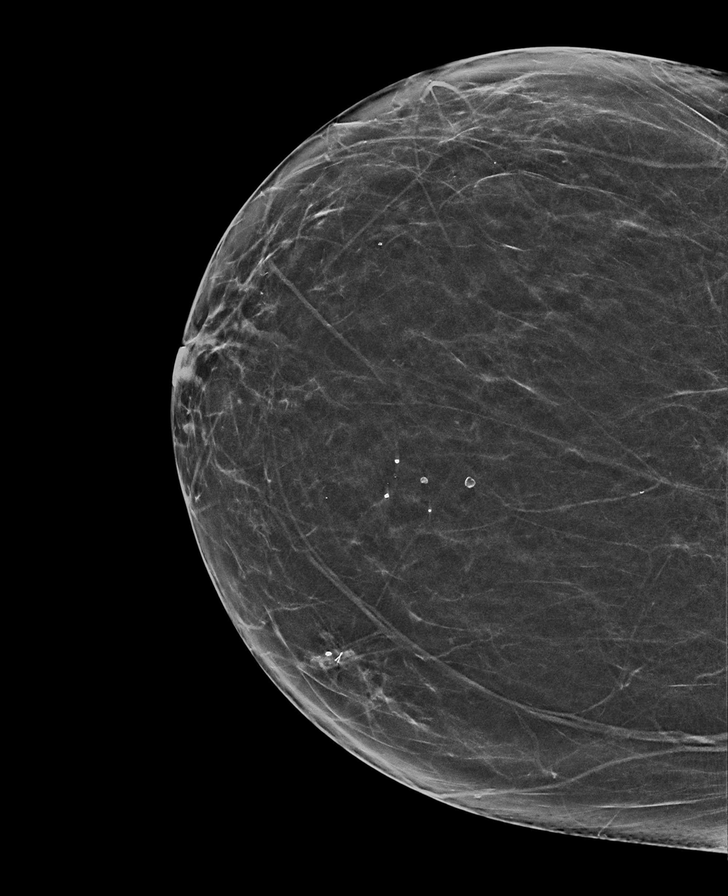

[L MLO synth-2D]
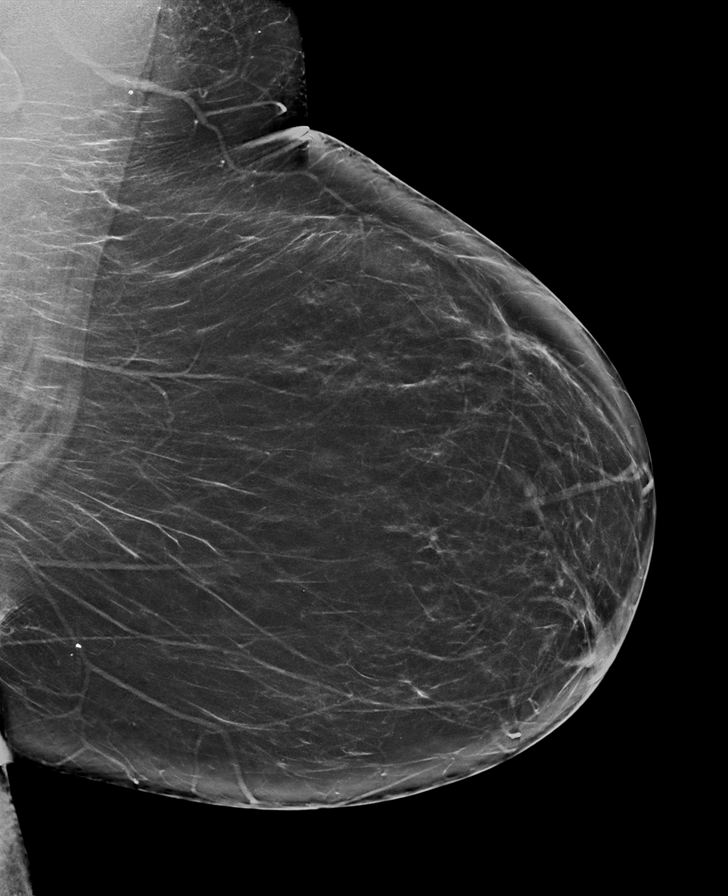

[L MLO tomo · tomo slice 42/83.0]
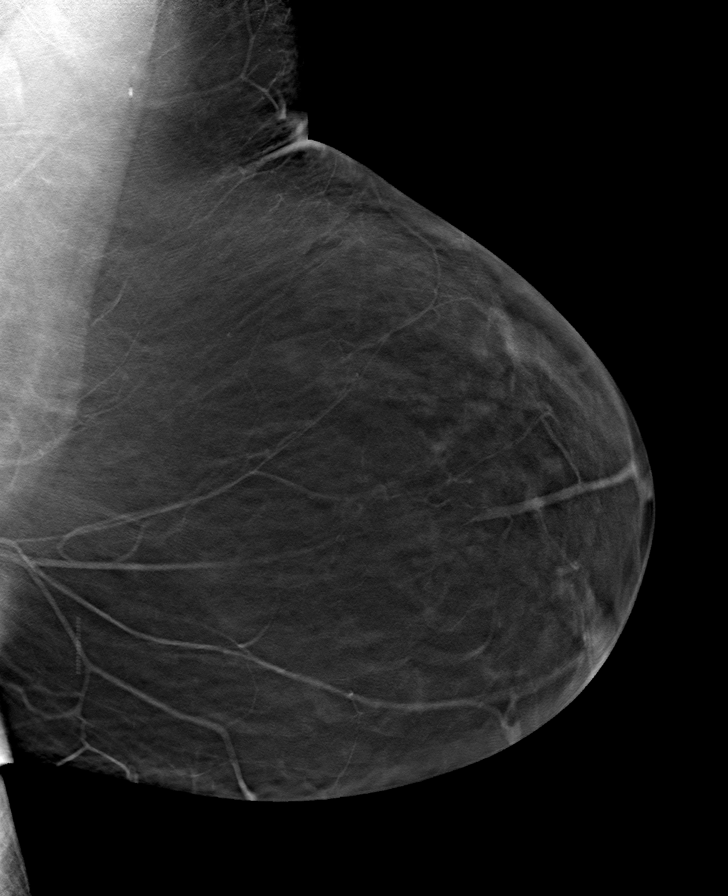

[L CC tomo · tomo slice 34/67.0]
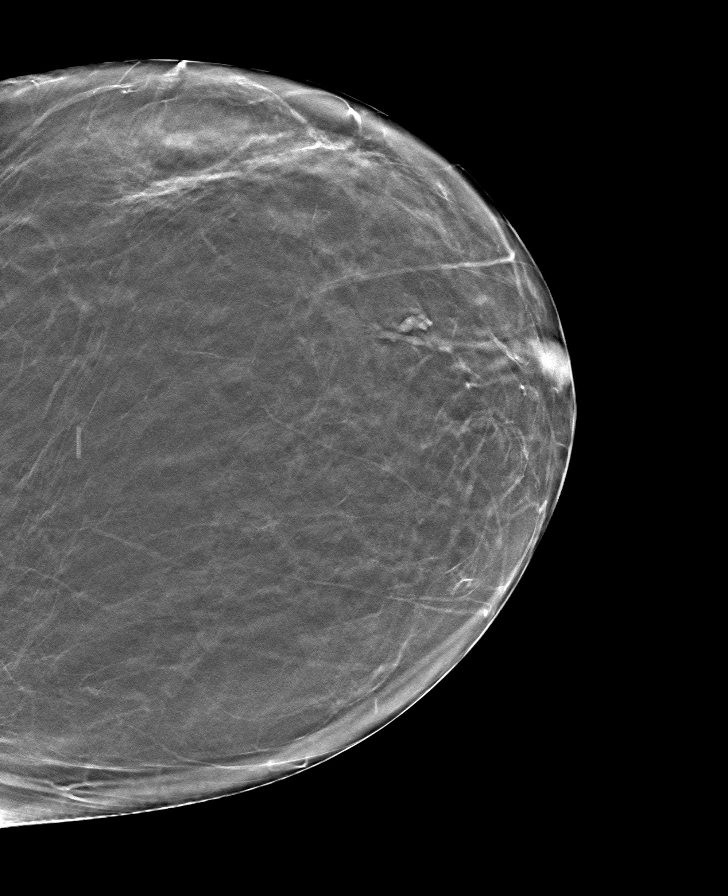

[R MLO tomo · tomo slice 42/83.0]
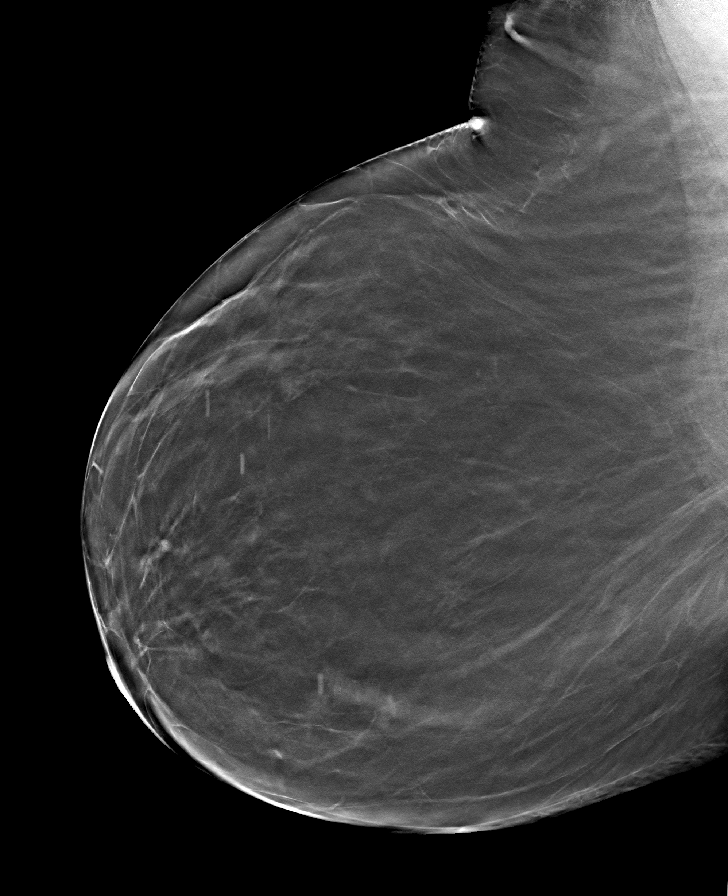

[R CC tomo · tomo slice 33/65.0]
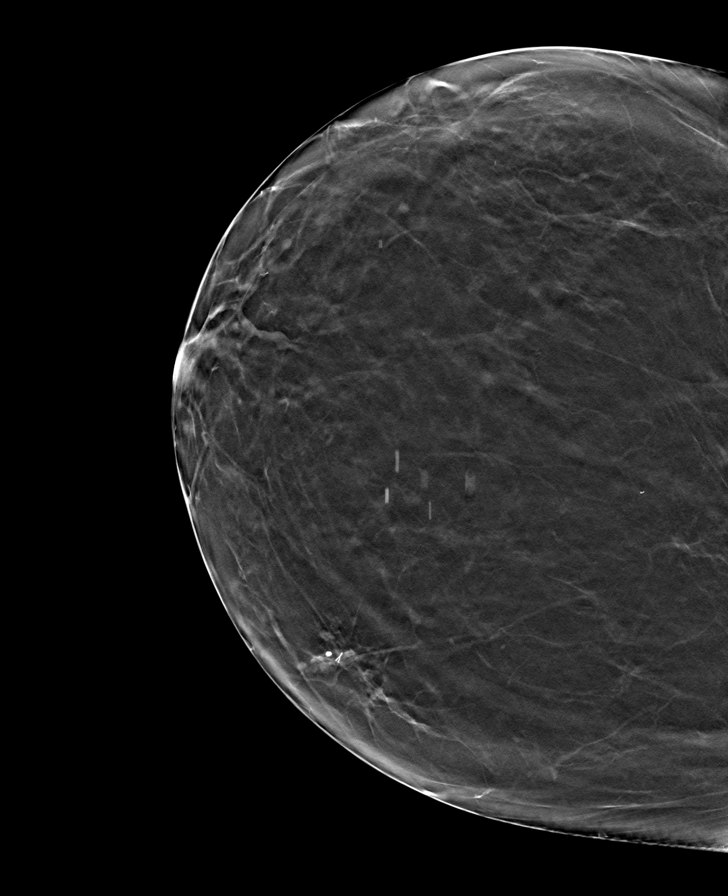

[8 of 24 positions shown; findings below may reference images not displayed]

FINDINGS: There are no findings suspicious for malignancy. Images were
processed with CAD.
IMPRESSION: No mammographic evidence of malignancy. A result letter of this
screening mammogram will be mailed directly to the patient.

RECOMMENDATION:
Screening mammogram in one year. (Code:[id number])

BI-RADS CATEGORY  1: Negative.

## 2021-04-19 ENCOUNTER — Ambulatory Visit: Payer: Managed Care, Other (non HMO) | Admitting: Dermatology

## 2021-04-19 ENCOUNTER — Other Ambulatory Visit: Payer: Self-pay

## 2021-04-19 DIAGNOSIS — Z86018 Personal history of other benign neoplasm: Secondary | ICD-10-CM | POA: Diagnosis not present

## 2021-04-19 DIAGNOSIS — L578 Other skin changes due to chronic exposure to nonionizing radiation: Secondary | ICD-10-CM

## 2021-04-19 DIAGNOSIS — D1800 Hemangioma unspecified site: Secondary | ICD-10-CM

## 2021-04-19 DIAGNOSIS — L821 Other seborrheic keratosis: Secondary | ICD-10-CM

## 2021-04-19 DIAGNOSIS — D225 Melanocytic nevi of trunk: Secondary | ICD-10-CM | POA: Diagnosis not present

## 2021-04-19 DIAGNOSIS — Z1283 Encounter for screening for malignant neoplasm of skin: Secondary | ICD-10-CM

## 2021-04-19 DIAGNOSIS — Z85828 Personal history of other malignant neoplasm of skin: Secondary | ICD-10-CM | POA: Diagnosis not present

## 2021-04-19 DIAGNOSIS — D229 Melanocytic nevi, unspecified: Secondary | ICD-10-CM

## 2021-04-19 DIAGNOSIS — L814 Other melanin hyperpigmentation: Secondary | ICD-10-CM

## 2021-04-19 NOTE — Patient Instructions (Addendum)
If you have any questions or concerns for your doctor, please call our main line at [phone] and press option 4 to reach your doctor's medical assistant. If no one answers, please leave a voicemail as directed and we will return your call as soon as possible. Messages left after 4 pm will be answered the following business day.   You may also send us a message via MyChart. We typically respond to MyChart messages within 1-2 business days.  For prescription refills, please ask your pharmacy to contact our office. Our fax number is [phone].  If you have an urgent issue when the clinic is closed that cannot wait until the next business day, you can page your doctor at the number below.    Please note that while we do our best to be available for urgent issues outside of office hours, we are not available 24/7.   If you have an urgent issue and are unable to reach us, you may choose to seek medical care at your doctor's office, retail clinic, urgent care center, or emergency room.  If you have a medical emergency, please immediately call 911 or go to the emergency department.  Pager Numbers  - Dr. Kowalski: [phone]  - Dr. Moye: [phone]  - Dr. Stewart: [phone]  In the event of inclement weather, please call our main line at [phone] for an update on the status of any delays or closures.  Dermatology Medication Tips: Please keep the boxes that topical medications come in in order to help keep track of the instructions about where and how to use these. Pharmacies typically print the medication instructions only on the boxes and not directly on the medication tubes.   If your medication is too expensive, please contact our office at [phone] option 4 or send us a message through MyChart.   We are unable to tell what your co-pay for medications will be in advance as this is different depending on your insurance coverage. However, we may be able to find a substitute  medication at lower cost or fill out paperwork to get insurance to cover a needed medication.   If a prior authorization is required to get your medication covered by your insurance company, please allow us 1-2 business days to complete this process.  Drug prices often vary depending on where the prescription is filled and some pharmacies may offer cheaper prices.  The website www.goodrx.com contains coupons for medications through different pharmacies. The prices here do not account for what the cost may be with help from insurance (it may be cheaper with your insurance), but the website can give you the price if you did not use any insurance.  - You can print the associated coupon and take it with your prescription to the pharmacy.  - You may also stop by our office during regular business hours and pick up a GoodRx coupon card.  - If you need your prescription sent electronically to a different pharmacy, notify our office through [facility] MyChart or by phone at [phone] option 4.  Recommend taking Heliocare sun protection supplement daily in sunny weather for additional sun protection. For maximum protection on the sunniest days, you can take up to 2 capsules of regular Heliocare OR take 1 capsule of Heliocare Ultra. For prolonged exposure (such as a full day in the sun), you can repeat your dose of the supplement 4 hours after your first dose. Heliocare can be purchased at [facility] or at www.heliocare.com.    

## 2021-04-19 NOTE — Progress Notes (Signed)
   Follow-Up Visit   Subjective  Erica Noble is a 60 y.o. female who presents for the following: Annual Exam (Hx BCC, dysplastic nevi ). The patient presents for Total-Body Skin Exam (TBSE) for skin cancer screening and mole check.  The following portions of the chart were reviewed this encounter and updated as appropriate:   Tobacco  Allergies  Meds  Problems  Med Hx  Surg Hx  Fam Hx     Review of Systems:  No other skin or systemic complaints except as noted in HPI or Assessment and Plan.  Objective  Well appearing patient in no apparent distress; mood and affect are within normal limits.  A full examination was performed including scalp, head, eyes, ears, nose, lips, neck, chest, axillae, abdomen, back, buttocks, bilateral upper extremities, bilateral lower extremities, hands, feet, fingers, toes, fingernails, and toenails. All findings within normal limits unless otherwise noted below.  L buttock Two slightly darker nevi without features suspicious for malignancy on dermoscopy    Assessment & Plan  Nevus L buttock  Benign-appearing under dermoscopy.  Observation.  Call clinic for new or changing moles.  Recommend daily use of broad spectrum spf 30+ sunscreen to sun-exposed areas.   Lentigines - Scattered tan macules - Due to sun exposure - Benign-appearing, observe - Recommend daily broad spectrum sunscreen SPF 30+ to sun-exposed areas, reapply every 2 hours as needed. - Call for any changes  Seborrheic Keratoses - Stuck-on, waxy, tan-brown papules and/or plaques  - Benign-appearing - Discussed benign etiology and prognosis. - Observe - Call for any changes  Melanocytic Nevi - Tan-brown and/or pink-flesh-colored symmetric macules and papules - Benign appearing on exam today - Observation - Call clinic for new or changing moles - Recommend daily use of broad spectrum spf 30+ sunscreen to sun-exposed areas.   Hemangiomas - Red papules - Discussed benign  nature - Observe - Call for any changes  Actinic Damage - Chronic condition, secondary to cumulative UV/sun exposure - diffuse scaly erythematous macules with underlying dyspigmentation - Recommend daily broad spectrum sunscreen SPF 30+ to sun-exposed areas, reapply every 2 hours as needed.  - Staying in the shade or wearing long sleeves, sun glasses (UVA+UVB protection) and wide brim hats (4-inch brim around the entire circumference of the hat) are also recommended for sun protection.  - Call for new or changing lesions.  History of Basal Cell Carcinoma of the Skin - No evidence of recurrence today - Recommend regular full body skin exams - Recommend daily broad spectrum sunscreen SPF 30+ to sun-exposed areas, reapply every 2 hours as needed.  - Call if any new or changing lesions are noted between office visits  History of Dysplastic Nevi - No evidence of recurrence today - Recommend regular full body skin exams - Recommend daily broad spectrum sunscreen SPF 30+ to sun-exposed areas, reapply every 2 hours as needed.  - Call if any new or changing lesions are noted between office visits  Skin cancer screening performed today.  Return in about 1 year (around 04/19/2022) for TBSE.  Luther Redo, CMA, am acting as scribe for Forest Gleason, MD .  Documentation: I have reviewed the above documentation for accuracy and completeness, and I agree with the above.  Forest Gleason, MD

## 2021-04-24 ENCOUNTER — Encounter: Payer: Self-pay | Admitting: Dermatology

## 2021-04-25 ENCOUNTER — Other Ambulatory Visit: Payer: Self-pay | Admitting: Family Medicine

## 2021-04-25 DIAGNOSIS — E039 Hypothyroidism, unspecified: Secondary | ICD-10-CM

## 2021-04-25 MED ORDER — LEVOTHYROXINE SODIUM 150 MCG PO TABS: 150.0000 ug | ORAL_TABLET | Freq: Every day | ORAL | 1 refills | Status: DC

## 2021-04-25 NOTE — Telephone Encounter (Signed)
Medication Refill - Medication:  levothyroxine (SYNTHROID) 150 MCG tablet  Has the patient contacted their pharmacy? Yes.   (Agent: If no, request that the patient contact the pharmacy for the refill.) (Agent: If yes, when and what did the pharmacy advise?)  Preferred Pharmacy (with phone number or street name): [place], [place]  Has the patient been seen for an appointment in the last year OR does the patient have an upcoming appointment? Yes.    [facility] [place] [facility]  [facility] [place] [facility] cannot [facility]. Pt would like to have the remaining 6 months supply of her levothyroxine sent to [facility].

## 2021-04-25 NOTE — Telephone Encounter (Signed)
Patient request remainder of RF sent to new pharmacy.

## 2021-04-30 ENCOUNTER — Other Ambulatory Visit: Payer: Self-pay | Admitting: Obstetrics and Gynecology

## 2021-04-30 DIAGNOSIS — Z1231 Encounter for screening mammogram for malignant neoplasm of breast: Secondary | ICD-10-CM

## 2021-05-09 ENCOUNTER — Ambulatory Visit: Payer: Managed Care, Other (non HMO) | Admitting: Dermatology

## 2021-05-16 ENCOUNTER — Ambulatory Visit: Payer: Managed Care, Other (non HMO)

## 2021-05-17 ENCOUNTER — Other Ambulatory Visit: Payer: Self-pay

## 2021-05-17 ENCOUNTER — Ambulatory Visit
Admission: RE | Admit: 2021-05-17 | Discharge: 2021-05-17 | Disposition: A | Discharge status: Home / Self Care | Payer: Managed Care, Other (non HMO) | Source: Ambulatory Visit | Attending: Obstetrics and Gynecology | Admitting: Obstetrics and Gynecology

## 2021-05-17 DIAGNOSIS — Z1231 Encounter for screening mammogram for malignant neoplasm of breast: Secondary | ICD-10-CM | POA: Diagnosis not present

## 2021-08-18 LAB — TSH: TSH: 0.208 u[IU]/mL — ABNORMAL LOW (ref 0.450–4.500)

## 2021-08-20 ENCOUNTER — Other Ambulatory Visit: Payer: Self-pay | Admitting: Family Medicine

## 2021-08-20 DIAGNOSIS — E039 Hypothyroidism, unspecified: Secondary | ICD-10-CM

## 2021-08-20 MED ORDER — LEVOTHYROXINE SODIUM 125 MCG PO TABS: 125.0000 ug | ORAL_TABLET | Freq: Every day | ORAL | 0 refills | Status: DC

## 2021-08-31 ENCOUNTER — Ambulatory Visit: Payer: Self-pay | Admitting: *Deleted

## 2021-08-31 NOTE — Telephone Encounter (Signed)
Summary: medication dose question   Pts levothyroxine (SYNTHROID) 125 MCG tablet dose was decreased 2 full steps / pts husband would like an explanation / and hes interested in Dr. Marlan Palau opinion / please advise     Patient's husband wants explanation about thyroid medication dosing. Why this particular dose was chosen. Would like call back from provider Reason for Disposition  [1] Caller has medicine question about med NOT prescribed by PCP AND [2] triager unable to answer question (e.g., compatibility with other med, storage)  Answer Assessment - Initial Assessment Questions 1. NAME of MEDICATION: "What medicine are you calling about?"     Levothyroxine 125 2. QUESTION: "What is your question?" (e.g., double dose of medicine, side effect)     Why was this dosing chosen 3. PRESCRIBING HCP: "Who prescribed it?" Reason: if prescribed by specialist, call should be referred to that group.     PCP Husband would like explanation of why this dose was chosen from provider  Protocols used: Medication Question Call-A-AH

## 2021-09-05 ENCOUNTER — Ambulatory Visit: Payer: Self-pay

## 2021-09-05 DIAGNOSIS — E039 Hypothyroidism, unspecified: Secondary | ICD-10-CM

## 2021-09-05 MED ORDER — LEVOTHYROXINE SODIUM 150 MCG PO TABS: 150.0000 ug | ORAL_TABLET | Freq: Every day | ORAL | 0 refills | Status: DC

## 2021-09-05 NOTE — Telephone Encounter (Signed)
°  Chief Complaint: medication dosage change Symptoms: feeling very fatigued since dose decreased to 17mcg Frequency: NA Pertinent Negatives: NA Disposition: [] ED /[] Urgent Care (no appt availability in office) / [] Appointment(In office/virtual)/ []  [facility]/ [] Home Care/ [] Refused Recommended Disposition /[] [place] [facility]/ [x]  Follow-up with PCP Additional Notes: Pt also has MS so unsure if MS getting worse or just where medication dosage was changed. Pt is wanting a call back with what is decided.   Reason for Disposition  [1] Caller has NON-URGENT medicine question about med that PCP prescribed AND [2] triager unable to answer question  Answer Assessment - Initial Assessment Questions 1. NAME of MEDICATION: "What medicine are you calling about?"     Synthroid  2. QUESTION: "What is your question?" (e.g., double dose of medicine, side effect)     Wanting dose increased back to 198mcg 3. PRESCRIBING HCP: "Who prescribed it?" Reason: if prescribed by specialist, call should be referred to that group.     Dr. Rosanna Randy 4. SYMPTOMS: "Do you have any symptoms?"     Feeling very fatigued 5. SEVERITY: If symptoms are present, ask "Are they mild, moderate or severe?"     Worse since dose decreased to 125  Protocols used: Medication Question Call-A-AH

## 2021-09-05 NOTE — Addendum Note (Signed)
Addended by: Myles Gip on: 09/05/2021 04:49 PM   Modules accepted: Orders

## 2021-09-05 NOTE — Telephone Encounter (Signed)
This is fine. We can go back to the 142mcg and recheck in 6 weeks. I sent a new rx to the pharmacy.

## 2021-09-05 NOTE — Telephone Encounter (Signed)
LM that new Rx was sent in to her pharmacy.

## 2021-09-07 NOTE — Addendum Note (Signed)
Addended by: Doristine Devoid on: 09/07/2021 02:37 PM   Modules accepted: Orders

## 2021-09-07 NOTE — Telephone Encounter (Signed)
Patient advised. Lab ordered.

## 2021-10-18 ENCOUNTER — Encounter: Payer: Managed Care, Other (non HMO) | Admitting: Family Medicine

## 2021-11-28 ENCOUNTER — Ambulatory Visit: Payer: Managed Care, Other (non HMO) | Admitting: Dermatology

## 2021-12-12 ENCOUNTER — Other Ambulatory Visit: Payer: Self-pay | Admitting: Family Medicine

## 2021-12-12 DIAGNOSIS — E039 Hypothyroidism, unspecified: Secondary | ICD-10-CM

## 2021-12-12 NOTE — Telephone Encounter (Signed)
Requested medications are due for refill today.  yes ? ?Requested medications are on the active medications list.  yes ? ?Last refill. 09/05/2021 #90 0 refills ? ?Future visit scheduled.   yes ? ?Notes to clinic.  Medication refill failed protocol d/t abnormal labs. ? ? ? ?Requested Prescriptions  ?Pending Prescriptions Disp Refills  ? levothyroxine (SYNTHROID) 150 MCG tablet 90 tablet 0  ?  Sig: Take 1 tablet (150 mcg total) by mouth daily before breakfast.  ?  ? Endocrinology:  Hypothyroid Agents Failed - 12/12/2021 11:50 AM  ?  ?  Failed - TSH in normal range and within 360 days  ?  TSH  ?Date Value Ref Range Status  ?08/17/2021 0.208 (L) 0.450 - 4.500 uIU/mL Final  ?   ?  ?  Passed - Valid encounter within last 12 months  ?  Recent Outpatient Visits   ? ?      ? 8 months ago Adult hypothyroidism  ? [facility] Jerrol Banana., MD  ? 1 year ago Annual physical exam  ? [facility] Jerrol Banana., MD  ? 1 year ago Acquired hypothyroidism  ? [facility] Jerrol Banana., MD  ? 2 years ago Adult hypothyroidism  ? [facility] Jerrol Banana., MD  ? 2 years ago Adult hypothyroidism  ? [facility] Jerrol Banana., MD  ? ?  ?  ?Future Appointments   ? ?        ? In 2 weeks Jerrol Banana., MD [facility]  ? In 4 months Ralene Bathe, MD [place]  ? ?  ? ? ?  ?  ?  ?  ?

## 2021-12-12 NOTE — Telephone Encounter (Signed)
Medication Refill - Medication: levothyroxine (SYNTHROID) 150 MCG ? ?Has the patient contacted their pharmacy? y ?(Agent: If no, request that the patient contact the pharmacy for the refill. If patient does not wish to contact the pharmacy document the reason why and proceed with request.) ?(Agent: If yes, when and what did the pharmacy advise?)contact pcp ? ?[facility]with [facility] [address], [place], [place] [address] [phone] ?Has the patient been seen for an appointment in the last year OR does the patient have an upcoming appointment? y ? ?Agent: Please be advised that RX refills may take up to 3 business days. We ask that you follow-up with your pharmacy.  ?

## 2022-01-01 ENCOUNTER — Ambulatory Visit (INDEPENDENT_AMBULATORY_CARE_PROVIDER_SITE_OTHER): Payer: Managed Care, Other (non HMO) | Admitting: Family Medicine

## 2022-01-01 ENCOUNTER — Encounter: Payer: Self-pay | Admitting: Family Medicine

## 2022-01-01 VITALS — BP 120/70 | HR 67 | Resp 16 | Ht 65.0 in | Wt 227.0 lb

## 2022-01-01 DIAGNOSIS — F411 Generalized anxiety disorder: Secondary | ICD-10-CM

## 2022-01-01 DIAGNOSIS — Z6835 Body mass index (BMI) 35.0-35.9, adult: Secondary | ICD-10-CM

## 2022-01-01 DIAGNOSIS — F32A Depression, unspecified: Secondary | ICD-10-CM | POA: Diagnosis not present

## 2022-01-01 DIAGNOSIS — E039 Hypothyroidism, unspecified: Secondary | ICD-10-CM | POA: Diagnosis not present

## 2022-01-01 DIAGNOSIS — R269 Unspecified abnormalities of gait and mobility: Secondary | ICD-10-CM

## 2022-01-01 DIAGNOSIS — E782 Mixed hyperlipidemia: Secondary | ICD-10-CM

## 2022-01-01 DIAGNOSIS — Z0000 Encounter for general adult medical examination without abnormal findings: Secondary | ICD-10-CM | POA: Diagnosis not present

## 2022-01-01 DIAGNOSIS — R739 Hyperglycemia, unspecified: Secondary | ICD-10-CM

## 2022-01-01 DIAGNOSIS — G35 Multiple sclerosis: Secondary | ICD-10-CM

## 2022-01-01 NOTE — Progress Notes (Signed)
Complete physical exam  I,April Miller,acting as a scribe for Wilhemena Durie, MD.,have documented all relevant documentation on the behalf of Wilhemena Durie, MD,as directed by  Wilhemena Durie, MD while in the presence of Wilhemena Durie, MD.   Patient: Erica Noble   DOB: Nov 18, 1960   60 y.o. Female  MRN: [id number] Visit Date: 01/01/2022  Today's healthcare provider: Wilhemena Durie, MD   Chief Complaint  Patient presents with   Annual Exam   Subjective    Erica Noble is a 61 y.o. female who presents today for a complete physical exam.  She reports consuming a general diet. The patient does not participate in regular exercise at present. She generally feels fairly well. She reports sleeping fairly well. She does not have additional problems to discuss today.  She is much less active because of her MS which is causing progressive gait problems.  She walks with a rollator.  She has had several falls but none that have injured her.  She mainly will slip back onto her chair if she feels her cell follow-up her husband states she is having some memory difficulties. HPI    Past Medical History:  Diagnosis Date   Allergy 05/26/2009   Allergic rhinitis   Basal cell carcinoma 01/17/2010   left upper lip/Moh's Dr. Lacinda Axon   Cancer [facility])    skin ca   History of dysplastic nevus 12/11/2007   mid back/severe   Hyperlipidemia    Multiple sclerosis ([place])    2008 diagnosis   Past Surgical History:  Procedure Laterality Date   BREAST BIOPSY Right 2014   bx/clip- neg   CHOLECYSTECTOMY  2000   history of thyroidectomy  2008   Social History   Socioeconomic History   Marital status: Married    Spouse name: Not on file   Number of children: Not on file   Years of education: Not on file   Highest education level: Not on file  Occupational History   Not on file  Tobacco Use   Smoking status: Former    Types: Cigarettes    Quit date: 07/29/1986    Years since  quitting: 35.4   Smokeless tobacco: Never  Substance and Sexual Activity   Alcohol use: No   Drug use: No   Sexual activity: Not on file  Other Topics Concern   Not on file  Social History Narrative   Not on file   Social Determinants of Health   Financial Resource Strain: Not on file  Food Insecurity: Not on file  Transportation Needs: Not on file  Physical Activity: Not on file  Stress: Not on file  Social Connections: Not on file  Intimate Partner Violence: Not on file   Family Status  Relation Name Status   Neg Hx  (Not Specified)   Family History  Adopted: Yes  Problem Relation Age of Onset   Breast cancer Neg Hx    No Known Allergies  Patient Care Team: Jerrol Banana., MD as PCP - General (Family Medicine)   Medications: Outpatient Medications Prior to Visit  Medication Sig   cholecalciferol (VITAMIN D3) 25 MCG (1000 UNIT) tablet Take 1,000 Units by mouth daily.   levothyroxine (SYNTHROID) 150 MCG tablet Take 1 tablet (150 mcg total) by mouth daily before breakfast.   [DISCONTINUED] mupirocin ointment (BACTROBAN) 2 % Apply 1 application topically daily. (Patient not taking: Reported on 03/22/2021)   [DISCONTINUED] sertraline (ZOLOFT) 50 MG tablet  Take 1 tablet (50 mg total) by mouth daily. (Patient not taking: Reported on 04/19/2021)   No facility-administered medications prior to visit.    Review of Systems  Constitutional:  Positive for fatigue.  Respiratory:  Positive for shortness of breath.   Musculoskeletal:  Positive for gait problem.  Neurological:  Positive for dizziness, weakness, numbness and headaches.  Psychiatric/Behavioral:  Positive for agitation.   All other systems reviewed and are negative.  Last hemoglobin A1c Lab Results  Component Value Date   HGBA1C 5.3 01/02/2022      Objective     BP 120/70 (BP Location: Right Arm, Patient Position: Sitting, Cuff Size: Large)   Pulse 67   Resp 16   Ht '5\' 5"'$  (1.651 m)   Wt 227 lb  (103 kg)   SpO2 96%   BMI 37.77 kg/m  BP Readings from Last 3 Encounters:  01/02/22 133/88  01/01/22 120/70  03/22/21 118/74   Wt Readings from Last 3 Encounters:  01/02/22 227 lb 8 oz (103.2 kg)  01/01/22 227 lb (103 kg)  03/22/21 229 lb (103.9 kg)       Physical Exam Vitals reviewed.  Constitutional:      Appearance: Normal appearance. She is obese.  HENT:     Head: Normocephalic and atraumatic.     Right Ear: Tympanic membrane, ear canal and external ear normal.     Left Ear: Tympanic membrane, ear canal and external ear normal.     Nose: Nose normal.     Mouth/Throat:     Pharynx: Oropharynx is clear.  Eyes:     General: No scleral icterus.    Conjunctiva/sclera: Conjunctivae normal.  Cardiovascular:     Rate and Rhythm: Normal rate and regular rhythm.     Pulses: Normal pulses.     Heart sounds: Normal heart sounds.  Pulmonary:     Effort: Pulmonary effort is normal.     Breath sounds: Normal breath sounds.  Abdominal:     Palpations: Abdomen is soft.  Musculoskeletal:        General: No swelling.     Right lower leg: No edema.     Left lower leg: No edema.  Lymphadenopathy:     Cervical: No cervical adenopathy.  Skin:    General: Skin is warm and dry.  Neurological:     Mental Status: She is alert and oriented to person, place, and time. Mental status is at baseline.     Coordination: Coordination abnormal.     Comments: Progressive weakness from MS. She is barely able to ambulate with help, basically shuffling from the chair to the exam table.  She was unable to take the 1 step up onto the exam table and her husband  essentially lifted her.  Psychiatric:        Mood and Affect: Mood normal.        Behavior: Behavior normal.        Thought Content: Thought content normal.        Judgment: Judgment normal.       Last depression screening scores    01/01/2022    1:58 PM 10/12/2020    3:21 PM 05/03/2020    2:38 PM  PHQ 2/9 Scores  PHQ - 2 Score '2 3  2  '$ PHQ- 9 Score '7 12 8   '$ Last fall risk screening    01/01/2022    1:57 PM  Fall Risk   Falls in the past year? 0  Number falls  in past yr: 0  Injury with Fall? 0  Risk for fall due to : No Fall Risks  Follow up Falls evaluation completed   Last Audit-C alcohol use screening    01/01/2022    1:57 PM  Alcohol Use Disorder Test (AUDIT)  1. How often do you have a drink containing alcohol? 0  2. How many drinks containing alcohol do you have on a typical day when you are drinking? 0  3. How often do you have six or more drinks on one occasion? 0  AUDIT-C Score 0   A score of 3 or more in women, and 4 or more in men indicates increased risk for alcohol abuse, EXCEPT if all of the points are from question 1   No results found for any visits on 01/01/22.  Assessment & Plan    Routine Health Maintenance and Physical Exam  Exercise Activities and Dietary recommendations  Goals   None     Immunization History  Administered Date(s) Administered   Influenza,inj,Quad PF,6+ Mos 04/28/2019, 05/03/2020   Influenza-Unspecified 04/18/2019, 04/28/2019   PFIZER Comirnaty(Gray Top)Covid-19 Tri-Sucrose Vaccine 03/23/2020   PFIZER(Purple Top)SARS-COV-2 Vaccination 10/24/2019, 11/17/2019   Tdap 12/30/2012    Health Maintenance  Topic Date Due   HIV Screening  Never done   Zoster Vaccines- Shingrix (1 of 2) Never done   COVID-19 Vaccine (4 - Booster for Pfizer series) 05/18/2020   COLONOSCOPY (Pts 45-33yr Insurance coverage will need to be confirmed)  11/07/2021   INFLUENZA VACCINE  02/26/2022   PAP SMEAR-Modifier  08/20/2022   TETANUS/TDAP  12/31/2022   MAMMOGRAM  05/18/2023   Hepatitis C Screening  Completed   HPV VACCINES  Aged Out    Discussed health benefits of physical activity, and encouraged her to engage in regular exercise appropriate for her age and condition.  1. Annual physical exam  - Lipid panel - TSH - CBC w/Diff/Platelet - Comprehensive Metabolic Panel  (CMET)  2. Adult hypothyroidism  - Lipid panel - TSH - CBC w/Diff/Platelet - Comprehensive Metabolic Panel (CMET)  3. GAD (generalized anxiety disorder)  - Lipid panel - TSH - CBC w/Diff/Platelet - Comprehensive Metabolic Panel (CMET)  4. Depression, unspecified depression type  - Lipid panel - TSH - CBC w/Diff/Platelet - Comprehensive Metabolic Panel (CMET)  5. Class 2 severe obesity due to excess calories with serious comorbidity and body mass index (BMI) of 35.0 to 35.9 in adult (HCC)  - Lipid panel - TSH - CBC w/Diff/Platelet - Comprehensive Metabolic Panel (CMET)  6. Mixed hyperlipidemia  - Lipid panel - TSH - CBC w/Diff/Platelet - Comprehensive Metabolic Panel (CMET)  7. Hyperglycemia  - Hemoglobin A1c  8. [place] Patient also have a mild cognitive impairment with MMSE of 27/30 today.  - Ambulatory referral to Physical Therapy  9. Gait disturbance Severe gait disturbance due to MS.  I think physical therapy would help. - Ambulatory referral to Physical Therapy   Return in about 4 months (around 05/03/2022).     .Wendi Maya MD  [phone](phone) [phone](fax)  [place]

## 2022-01-02 ENCOUNTER — Encounter: Payer: Self-pay | Admitting: Neurology

## 2022-01-02 ENCOUNTER — Telehealth: Payer: Self-pay | Admitting: Neurology

## 2022-01-02 ENCOUNTER — Ambulatory Visit: Payer: Managed Care, Other (non HMO) | Admitting: Neurology

## 2022-01-02 VITALS — BP 133/88 | HR 60 | Ht 65.0 in | Wt 227.5 lb

## 2022-01-02 DIAGNOSIS — Z79899 Other long term (current) drug therapy: Secondary | ICD-10-CM | POA: Diagnosis not present

## 2022-01-02 DIAGNOSIS — R269 Unspecified abnormalities of gait and mobility: Secondary | ICD-10-CM

## 2022-01-02 DIAGNOSIS — G35 Multiple sclerosis: Secondary | ICD-10-CM | POA: Diagnosis not present

## 2022-01-02 NOTE — Progress Notes (Signed)
[facility]  PATIENT: Erica Noble DOB: Oct 25, 1960  REFERRING DOCTOR OR PCP: Jennings Books MD (neurology); Miguel Aschoff, MD (PCP)  SOURCE: Patient, notes from Dr. Manuella Ghazi, imaging and lab reports, MRI images personally reviewed.  _________________________________   HISTORICAL  CHIEF COMPLAINT:  Chief Complaint  Patient presents with   New Patient (Initial Visit)    Rm 2, husband.    New patient,  no treatment.  In wheelchair.      HISTORY OF PRESENT ILLNESS:  I had the pleasure of seeing your patient, Erica Noble, at the [place] [facility] for neurologic consultation regarding her multiple sclerosis.  She is a 61 year old woman who was diagnosed with MS after earlier presenting with diplopia and then having HA and visual change    An MRI was c/w MS.     She had enhancng lesions.   She was referred to Dr, Rochele Raring and was placed on Rebif.   She had a major relapse 6 months later affecting gait/balance and she received 5 days of IV Solu-medrol.   She started to see Dr. Jacqulynn Cadet at [facility] and was placed in the [place] trial and placed on drug x 2 years.   She was in the OLE as well.  Around 2012 or 2013, there were additional foci on MRi and a re-dose was discussed   The next MRI showed no new foci and they decided against futher re-dose.   She has never been on another DMT.  sround 2019 she started a cane and walker.     Currently, she uses a cane around the house and a Rollator outside (chair for long distance).     Her left side feels weaker than the right.   She notes some edema in feet/ankles.   She also has right foot pain saw podiatrist and compression socks recommended.   Arms are strong.   No significant spasticity in legs.   She has some right arm discomfort.     She has urinary urgency and rare accidents but more last year than previous year.   She is not on any bladder medication.    She feels her vision is good.  She has a lot  of fatigue, worse as day goes on and with heat.   His seems worse the past year.   She is moody and irritable and sometimes angry.  This seems harder to control.   She has mild cognitive issues with reduced processing and word finding at times.    Reduced focus/attention.  She sleeps well most nights.     She was adopted so does not know FH.      Imaging: I personally reviewed the MRI of the brain dated 04/07/2007.  It shows multiple T2/FLAIR hyperintense foci in the periventricular, juxtacortical and deep white matter.  Foci also noted in the left cerebellum, left greater than right middle cerebellar peduncles, left thalamus.  That study also showed enhancement of some of the foci in the hemispheres and 1 small focus in the left cerebellum.  MRI of the brain 09/29/2019 by report by report showed additional foci and atrophy compared to 2008  REVIEW OF SYSTEMS: Constitutional: No fevers, chills, sweats, or change in appetite Eyes: No visual changes, double vision, eye pain Ear, nose and throat: No hearing loss, ear pain, nasal congestion, sore throat Cardiovascular: No chest pain, palpitations Respiratory:  No shortness of breath at rest or with exertion.   No wheezes GastrointestinaI: No nausea, vomiting,  diarrhea, abdominal pain, fecal incontinence Genitourinary:  No dysuria, urinary retention or frequency.  No nocturia. Musculoskeletal:  No neck pain, back pain Integumentary: No rash, pruritus, skin lesions Neurological: as above Psychiatric: No depression at this time.  No anxiety Endocrine: No palpitations, diaphoresis, change in appetite, change in weigh or increased thirst Hematologic/Lymphatic:  No anemia, purpura, petechiae. Allergic/Immunologic: No itchy/runny eyes, nasal congestion, recent allergic reactions, rashes  ALLERGIES: No Known Allergies  HOME MEDICATIONS:  Current Outpatient Medications:    cholecalciferol (VITAMIN D3) 25 MCG (1000 UNIT) tablet, Take 1,000 Units by  mouth daily., Disp: , Rfl:    levothyroxine (SYNTHROID) 150 MCG tablet, Take 1 tablet (150 mcg total) by mouth daily before breakfast., Disp: 90 tablet, Rfl: 0   OVER THE COUNTER MEDICATION, daily. NAG supplement, for inflammation., Disp: , Rfl:   PAST MEDICAL HISTORY: Past Medical History:  Diagnosis Date   Allergy 05/26/2009   Allergic rhinitis   Basal cell carcinoma 01/17/2010   left upper lip/Moh's Dr. Lacinda Axon   Cancer [facility])    skin ca   H/O thyroidectomy 2006   History of dysplastic nevus 12/11/2007   mid back/severe   Hyperlipidemia    Multiple sclerosis ([place])    2008 diagnosis    PAST SURGICAL HISTORY: Past Surgical History:  Procedure Laterality Date   BREAST BIOPSY Right 2014   bx/clip- neg   CHOLECYSTECTOMY  2000   history of thyroidectomy  2008    FAMILY HISTORY: Family History  Adopted: Yes  Problem Relation Age of Onset   Breast cancer Neg Hx     SOCIAL HISTORY:  Social History   Socioeconomic History   Marital status: Married    Spouse name: Not on file   Number of children: Not on file   Years of education: Not on file   Highest education level: Not on file  Occupational History   Not on file  Tobacco Use   Smoking status: Former    Types: Cigarettes    Quit date: 07/29/1986    Years since quitting: 35.4   Smokeless tobacco: Never  Vaping Use   Vaping Use: Never used  Substance and Sexual Activity   Alcohol use: No    Comment: occ   Drug use: No   Sexual activity: Not on file  Other Topics Concern   Not on file  Social History Narrative   Caffeine tea 1.5 glasses daily. Education:  BA. Work previous Medical illustrator.   Disability with [facility].   Social Determinants of Health   Financial Resource Strain: Not on file  Food Insecurity: Not on file  Transportation Needs: Not on file  Physical Activity: Not on file  Stress: Not on file  Social Connections: Not on file  Intimate Partner Violence: Not on file     PHYSICAL  EXAM  Vitals:   01/02/22 0848  BP: 133/88  Pulse: 60  Weight: 227 lb 8 oz (103.2 kg)  Height: '5\' 5"'$  (1.651 m)    Body mass index is 37.86 kg/m.   General: The patient is well-developed and well-nourished and in no acute distress  HEENT:  Head is [place]/AT.  Sclera are anicteric.  Funduscopic exam shows normal optic discs and retinal vessels.  Neck: No carotid bruits are noted.  The neck is nontender.  Cardiovascular: The heart has a regular rate and rhythm with a normal S1 and S2. There were no murmurs, gallops or rubs.    Skin: Extremities are without rash or  edema.  Musculoskeletal:  Back is nontender  Neurologic Exam  Mental status: The patient is alert and oriented x 3 at the time of the examination. The patient has apparent normal recent and remote memory, with an apparently normal attention span and concentration ability.   Speech is normal.  Cranial nerves: Extraocular movements are full. Pupils are equal, round, and reactive to light and accomodation.  Visual fields are full.  Facial symmetry is present. There is good facial sensation to soft touch bilaterally.Facial strength is normal.  Trapezius and sternocleidomastoid strength is normal. No dysarthria is noted.  The tongue is midline, and the patient has symmetric elevation of the soft palate. No obvious hearing deficits are noted.  Motor:  Muscle bulk is normal.   Tone is increased in legs, left greater than right.  Strength was 4/5 in the legs, a little worse on the left..   Sensory: Sensory testing is intact to pinprick, soft touch and vibration sensation in arms and reduced vibration in legs, relative to the arms.  Coordination: Cerebellar testing reveals good finger-nose-finger and poor heel-to-shin .  Gait and station: She needs support to stand up but can stay standing without support.  Her gait in the room was poor and she does much better with a walker.  Romberg was borderline.  Reflexes: Deep tendon reflexes  are symmetric and normal in arms, increased at the knees with crossed abductors.  No ankle clonus..   Plantar responses are flexor.    DIAGNOSTIC DATA (LABS, IMAGING, TESTING) - I reviewed patient records, labs, notes, testing and imaging myself where available.  Lab Results  Component Value Date   WBC 6.7 01/02/2022   HGB 14.5 01/02/2022   HCT 41.9 01/02/2022   MCV 93 01/02/2022   PLT 214 01/02/2022      Component Value Date/Time   NA 143 01/02/2022 1116   K 4.2 01/02/2022 1116   CL 107 (H) 01/02/2022 1116   CO2 24 01/02/2022 1116   GLUCOSE 101 (H) 01/02/2022 1116   BUN 12 01/02/2022 1116   CREATININE 0.74 01/02/2022 1116   CALCIUM 9.5 01/02/2022 1116   PROT 7.2 01/02/2022 1116   ALBUMIN 4.5 01/02/2022 1116   AST 18 01/02/2022 1116   ALT 19 01/02/2022 1116   ALKPHOS 70 01/02/2022 1116   BILITOT 0.6 01/02/2022 1116   GFRNONAA 82 04/28/2019 1631   GFRAA 95 04/28/2019 1631   Lab Results  Component Value Date   CHOL 178 01/02/2022   HDL 56 01/02/2022   LDLCALC 110 (H) 01/02/2022   TRIG 63 01/02/2022   CHOLHDL 3.2 01/02/2022   Lab Results  Component Value Date   HGBA1C 5.3 01/02/2022   Lab Results  Component Value Date   [id number] 04/28/2019   Lab Results  Component Value Date   TSH 0.125 (L) 01/02/2022       ASSESSMENT AND PLAN  Multiple sclerosis (HCC) - Plan: Hepatitis B surface antigen, IgG, IgA, IgM, HIV Antibody (routine testing w rflx), QuantiFERON-TB Gold Plus, Varicella zoster antibody, IgG, Hepatitis B surface antibody,qualitative, Hepatitis C antibody, Hepatitis B core antibody, total, Comprehensive metabolic panel, CBC with Differential/Platelet, MR CERVICAL SPINE W WO CONTRAST, MR BRAIN W WO CONTRAST, CD20 B Cells  High risk medication use - Plan: Hepatitis B surface antigen, IgG, IgA, IgM, HIV Antibody (routine testing w rflx), QuantiFERON-TB Gold Plus, Varicella zoster antibody, IgG, Hepatitis B surface antibody,qualitative, Hepatitis C  antibody, Hepatitis B core antibody, total, Comprehensive metabolic panel, CBC with Differential/Platelet, CD20 B Cells  Gait disturbance - Plan: MR CERVICAL SPINE W WO CONTRAST, MR BRAIN W WO CONTRAST    In summary, Erica Noble is a 61 year old woman with multiple sclerosis previously treated with alemtuzumab who has had breakthrough activity on MRI and progression of her gait disturbance over the past few years.  Because of that, I recommend that we initiate a disease modifying therapy.  To better characterize the aggressiveness of her MS we need to check an MRI of the brain and cervical spine as that will help guide therapy.  We discussed that many people can get 4 to 8 years of benefit after [place] but over time most patients will start to have relapses again.  Options include an additional year of alemtuzumab or initiation of one of the CD20 drugs (Ocrevus, Briumvi or Kesimpta).   We will check lab work to see if she is a candidate for this category of medications.  If not, we will need to consider a different one.  We also discussed that there are some medications underdevelopment that might eventually be shown to have better benefit for progressive changes in MS (BTK inhibitors CD40 ligand inhibitors) these medications are not available outside of clinical studies at this time.  I will give them a call after she has the MRIs so we can decide upon treatment.  She will return to see me in a couple months or sooner if there are new or worsening neurologic symptoms.  Thank you for asking me to see Erica Noble.  Please let me know if I can be of further assistance with her or other patients in the future.     Chole Driver A. Felecia Shelling, MD, [facility] 0/09/90, 3:30 PM Certified in Neurology, Clinical Neurophysiology, Sleep Medicine and Neuroimaging  [facility] [address], [place], [place] [address] [phone]

## 2022-01-02 NOTE — Telephone Encounter (Signed)
sent to GI they obtain Erica Noble and call patient to schedule

## 2022-01-03 ENCOUNTER — Encounter: Payer: Self-pay | Admitting: Neurology

## 2022-01-03 DIAGNOSIS — R269 Unspecified abnormalities of gait and mobility: Secondary | ICD-10-CM | POA: Insufficient documentation

## 2022-01-03 LAB — CBC WITH DIFFERENTIAL/PLATELET
Basophils Absolute: 0.1 10*3/uL (ref 0.0–0.2)
Basos: 1 %
EOS (ABSOLUTE): 0.1 10*3/uL (ref 0.0–0.4)
Eos: 1 %
Hematocrit: 42.9 % (ref 34.0–46.6)
Hemoglobin: 14.6 g/dL (ref 11.1–15.9)
Immature Grans (Abs): 0 10*3/uL (ref 0.0–0.1)
Immature Granulocytes: 0 %
Lymphocytes Absolute: 2.2 10*3/uL (ref 0.7–3.1)
Lymphs: 32 %
MCH: 32.6 pg (ref 26.6–33.0)
MCHC: 34 g/dL (ref 31.5–35.7)
MCV: 96 fL (ref 79–97)
Monocytes Absolute: 0.6 10*3/uL (ref 0.1–0.9)
Monocytes: 9 %
Neutrophils Absolute: 3.9 10*3/uL (ref 1.4–7.0)
Neutrophils: 57 %
Platelets: 208 10*3/uL (ref 150–450)
RBC: 4.48 x10E6/uL (ref 3.77–5.28)
RDW: 12.4 % (ref 11.7–15.4)
WBC: 6.8 10*3/uL (ref 3.4–10.8)

## 2022-01-03 LAB — COMPREHENSIVE METABOLIC PANEL
ALT: 21 IU/L (ref 0–32)
AST: 20 IU/L (ref 0–40)
Albumin/Globulin Ratio: 1.8 (ref 1.2–2.2)
Albumin: 4.5 g/dL (ref 3.8–4.9)
Alkaline Phosphatase: 70 IU/L (ref 44–121)
BUN/Creatinine Ratio: 19 (ref 12–28)
BUN: 14 mg/dL (ref 8–27)
Bilirubin Total: 0.5 mg/dL (ref 0.0–1.2)
CO2: 22 mmol/L (ref 20–29)
Calcium: 9.4 mg/dL (ref 8.7–10.3)
Chloride: 106 mmol/L (ref 96–106)
Creatinine, Ser: 0.73 mg/dL (ref 0.57–1.00)
Globulin, Total: 2.5 g/dL (ref 1.5–4.5)
Glucose: 104 mg/dL — ABNORMAL HIGH (ref 70–99)
Potassium: 4.2 mmol/L (ref 3.5–5.2)
Sodium: 144 mmol/L (ref 134–144)
Total Protein: 7 g/dL (ref 6.0–8.5)
eGFR: 94 mL/min/{1.73_m2} (ref >59)

## 2022-01-03 LAB — HEMOGLOBIN A1C
Est. average glucose Bld gHb Est-mCnc: 105 mg/dL
Hgb A1c MFr Bld: 5.3 % (ref 4.8–5.6)

## 2022-01-03 LAB — TSH: TSH: 0.125 u[IU]/mL — ABNORMAL LOW (ref 0.450–4.500)

## 2022-01-03 LAB — LIPID PANEL
Chol/HDL Ratio: 3.2 ratio (ref 0.0–4.4)
Cholesterol, Total: 178 mg/dL (ref 100–199)
HDL: 56 mg/dL (ref >39)
LDL Chol Calc (NIH): 110 mg/dL — ABNORMAL HIGH (ref 0–99)
Triglycerides: 63 mg/dL (ref 0–149)
VLDL Cholesterol Cal: 12 mg/dL (ref 5–40)

## 2022-01-05 LAB — CBC WITH DIFFERENTIAL/PLATELET
Basophils Absolute: 0.1 10*3/uL (ref 0.0–0.2)
Basos: 1 %
EOS (ABSOLUTE): 0.1 10*3/uL (ref 0.0–0.4)
Eos: 1 %
Hematocrit: 41.9 % (ref 34.0–46.6)
Hemoglobin: 14.5 g/dL (ref 11.1–15.9)
Immature Grans (Abs): 0 10*3/uL (ref 0.0–0.1)
Immature Granulocytes: 0 %
Lymphocytes Absolute: 2.1 10*3/uL (ref 0.7–3.1)
Lymphs: 31 %
MCH: 32.1 pg (ref 26.6–33.0)
MCHC: 34.6 g/dL (ref 31.5–35.7)
MCV: 93 fL (ref 79–97)
Monocytes Absolute: 0.5 10*3/uL (ref 0.1–0.9)
Monocytes: 8 %
Neutrophils Absolute: 3.9 10*3/uL (ref 1.4–7.0)
Neutrophils: 59 %
Platelets: 214 10*3/uL (ref 150–450)
RBC: 4.52 x10E6/uL (ref 3.77–5.28)
RDW: 12.2 % (ref 11.7–15.4)
WBC: 6.7 10*3/uL (ref 3.4–10.8)

## 2022-01-05 LAB — COMPREHENSIVE METABOLIC PANEL
ALT: 19 IU/L (ref 0–32)
AST: 18 IU/L (ref 0–40)
Albumin/Globulin Ratio: 1.7 (ref 1.2–2.2)
Albumin: 4.5 g/dL (ref 3.8–4.9)
Alkaline Phosphatase: 70 IU/L (ref 44–121)
BUN/Creatinine Ratio: 16 (ref 12–28)
BUN: 12 mg/dL (ref 8–27)
Bilirubin Total: 0.6 mg/dL (ref 0.0–1.2)
CO2: 24 mmol/L (ref 20–29)
Calcium: 9.5 mg/dL (ref 8.7–10.3)
Chloride: 107 mmol/L — ABNORMAL HIGH (ref 96–106)
Creatinine, Ser: 0.74 mg/dL (ref 0.57–1.00)
Globulin, Total: 2.7 g/dL (ref 1.5–4.5)
Glucose: 101 mg/dL — ABNORMAL HIGH (ref 70–99)
Potassium: 4.2 mmol/L (ref 3.5–5.2)
Sodium: 143 mmol/L (ref 134–144)
Total Protein: 7.2 g/dL (ref 6.0–8.5)
eGFR: 93 mL/min/{1.73_m2} (ref >59)

## 2022-01-05 LAB — QUANTIFERON-TB GOLD PLUS
QuantiFERON Mitogen Value: >10 IU/mL
QuantiFERON Nil Value: 0.11 IU/mL
QuantiFERON TB1 Ag Value: 0.22 IU/mL
QuantiFERON TB2 Ag Value: 0.25 IU/mL
QuantiFERON-TB Gold Plus: NEGATIVE

## 2022-01-05 LAB — HEPATITIS B CORE ANTIBODY, TOTAL: Hep B Core Total Ab: NEGATIVE

## 2022-01-05 LAB — HIV ANTIBODY (ROUTINE TESTING W REFLEX): HIV Screen 4th Generation wRfx: NONREACTIVE

## 2022-01-05 LAB — IGG, IGA, IGM
IgA/Immunoglobulin A, Serum: 280 mg/dL (ref 87–352)
IgG (Immunoglobin G), Serum: 1074 mg/dL (ref 586–1602)
IgM (Immunoglobulin M), Srm: 69 mg/dL (ref 26–217)

## 2022-01-05 LAB — HEPATITIS B SURFACE ANTIGEN: Hepatitis B Surface Ag: NEGATIVE

## 2022-01-05 LAB — CD20 B CELLS
% CD19-B Cells: 12.5 % (ref 4.6–22.1)
% CD20-B Cells: 12.4 % (ref 5.0–22.3)

## 2022-01-05 LAB — HEPATITIS B SURFACE ANTIBODY,QUALITATIVE: Hep B Surface Ab, Qual: NONREACTIVE

## 2022-01-05 LAB — HEPATITIS C ANTIBODY: Hep C Virus Ab: NONREACTIVE

## 2022-01-05 LAB — VARICELLA ZOSTER ANTIBODY, IGG: Varicella zoster IgG: 2466 index (ref >165)

## 2022-01-07 ENCOUNTER — Telehealth: Payer: Self-pay | Admitting: Neurology

## 2022-01-07 NOTE — Telephone Encounter (Signed)
I spoke to Erica Noble about the lab work.  They are normal and we can proceed with one of the anti-CD20 agents.  We discussed the options and she would prefer an infusion such as Ocrevus rather than Kesimpta.

## 2022-01-08 ENCOUNTER — Telehealth: Payer: Self-pay

## 2022-01-08 DIAGNOSIS — E039 Hypothyroidism, unspecified: Secondary | ICD-10-CM

## 2022-01-08 MED ORDER — LEVOTHYROXINE SODIUM 125 MCG PO TABS: 125.0000 ug | ORAL_TABLET | Freq: Every day | ORAL | 1 refills | Status: DC

## 2022-01-08 NOTE — Telephone Encounter (Signed)
-----   Message from Hoyle Barr, RN sent at 01/08/2022  3:23 PM EDT ----- Spoke with pt, gave her results and provider comments, she would like the new dose of Synthroid, ('125mg'$ ) to be called into [facility] on [address] in [place].

## 2022-01-08 NOTE — Telephone Encounter (Signed)
I called patient to discuss. No answer, left a message asking her to call me back. ?

## 2022-01-14 ENCOUNTER — Telehealth: Payer: Self-pay | Admitting: Neurology

## 2022-01-14 NOTE — Telephone Encounter (Signed)
I called patient again to discuss. No answer, left a message asking her to call us back. If patient calls back and I am unable to take the call please ask for the best call back number.

## 2022-01-14 NOTE — Telephone Encounter (Signed)
Patient returned my call. She will complete the Ocrevus consent form from the link I provided on mychart. She will let me know when this is completed.

## 2022-01-14 NOTE — Telephone Encounter (Signed)
See other telephone encounter regarding this.

## 2022-01-14 NOTE — Telephone Encounter (Signed)
Pt said, insurance need information from Dr. Felecia Shelling to be able to approve infusions. Would like a call from the nurse.

## 2022-01-15 NOTE — Telephone Encounter (Signed)
Faxed Ocrevus prescriber start form to [employer]. Received a receipt of confirmation.  When patient confirms that she completed her portion of the start form I will complete the order for the infusion suite.

## 2022-01-17 ENCOUNTER — Ambulatory Visit
Admission: RE | Admit: 2022-01-17 | Discharge: 2022-01-17 | Disposition: A | Discharge status: Home / Self Care | Payer: Managed Care, Other (non HMO) | Source: Ambulatory Visit | Attending: Neurology | Admitting: Neurology

## 2022-01-17 DIAGNOSIS — R269 Unspecified abnormalities of gait and mobility: Secondary | ICD-10-CM

## 2022-01-17 DIAGNOSIS — G35 Multiple sclerosis: Secondary | ICD-10-CM | POA: Diagnosis not present

## 2022-01-17 MED ORDER — GADOBENATE DIMEGLUMINE 529 MG/ML IV SOLN
20.0000 mL | Freq: Once | INTRAVENOUS | Status: AC | PRN
  Administered 2022-01-17: 20 mL via INTRAVENOUS

## 2022-01-21 ENCOUNTER — Telehealth: Payer: Self-pay | Admitting: Neurology

## 2022-01-22 NOTE — Telephone Encounter (Signed)
I called patient to confirm that she has completed her portion of the Ocrevus start form online.  No answer, left a voicemail asking her to call us back.

## 2022-02-08 NOTE — Telephone Encounter (Signed)
Pt calling check on status of scheduling infusion appt.

## 2022-02-11 NOTE — Telephone Encounter (Signed)
Received this notice from the infusion suite: "still pending INS [email or web address] called already today and were told up to 14 calendar days from 7/14"

## 2022-04-05 ENCOUNTER — Ambulatory Visit: Payer: Managed Care, Other (non HMO) | Admitting: Family Medicine

## 2022-04-05 ENCOUNTER — Encounter: Payer: Self-pay | Admitting: Family Medicine

## 2022-04-05 VITALS — BP 108/70 | HR 73 | Temp 98.0°F | Resp 16 | Wt 225.5 lb

## 2022-04-05 DIAGNOSIS — R319 Hematuria, unspecified: Secondary | ICD-10-CM

## 2022-04-05 DIAGNOSIS — R350 Frequency of micturition: Secondary | ICD-10-CM

## 2022-04-05 LAB — POCT URINALYSIS DIPSTICK
Bilirubin, UA: NEGATIVE
Glucose, UA: NEGATIVE
Ketones, UA: NEGATIVE
Leukocytes, UA: NEGATIVE
Nitrite, UA: NEGATIVE
Protein, UA: NEGATIVE
Spec Grav, UA: >=1.03 — AB (ref 1.010–1.025)
Urobilinogen, UA: 0.2 E.U./dL
pH, UA: 6 (ref 5.0–8.0)

## 2022-04-05 NOTE — Patient Instructions (Signed)
We will wait for the results of your lab work to see if you need antibiotics vs another medication.

## 2022-04-05 NOTE — Progress Notes (Unsigned)
I,Sulibeya S Dimas,acting as a Education administrator for [employer], MD.,have documented all relevant documentation on the behalf of [name], MD,as directed by  [name], MD while in the presence of [name], MD.     Established patient visit   Patient: Erica Noble   DOB: Dec 29, 1960   61 y.o. Female  MRN: [id number] Visit Date: 04/05/2022  Today's healthcare provider: [name], MD   Chief Complaint  Patient presents with   Urinary Tract Infection   Subjective    Dysuria  This is a new problem. The current episode started in the past 7 days. The problem occurs every urination. The patient is experiencing no pain. There has been no fever. Associated symptoms include frequency, hematuria and urgency. Associated symptoms comments: "Electricity" in her upper body through her arms and hands. She has tried nothing for the symptoms.    Medications: Outpatient Medications Prior to Visit  Medication Sig   cholecalciferol (VITAMIN D3) 25 MCG (1000 UNIT) tablet Take 1,000 Units by mouth daily.   Cyanocobalamin (VITAMIN B 12 PO) Take by mouth.   levothyroxine (SYNTHROID) 125 MCG tablet Take 1 tablet (125 mcg total) by mouth daily before breakfast.   [DISCONTINUED] OVER THE COUNTER MEDICATION daily. NAG supplement, for inflammation. (Patient not taking: Reported on 04/05/2022)   No facility-administered medications prior to visit.    Review of Systems  Gastrointestinal:  Negative for abdominal pain.  Genitourinary:  Positive for dysuria, frequency, hematuria and urgency.  Musculoskeletal:  Negative for back pain.  Neurological:        Difficult ambulation        Objective    BP 108/70 (BP Location: Left Arm, Patient Position: Sitting, Cuff Size: Large)   Pulse 73   Temp 98 F (36.7 C) (Oral)   Resp 16   Wt 225 lb 8 oz (102.3 kg)   BMI 37.53 kg/m  BP Readings from Last 3 Encounters:  04/05/22 108/70  01/02/22  133/88  01/01/22 120/70   Wt Readings from Last 3 Encounters:  04/05/22 225 lb 8 oz (102.3 kg)  01/02/22 227 lb 8 oz (103.2 kg)  01/01/22 227 lb (103 kg)      Physical Exam Constitutional:      General: She is not in acute distress.    Appearance: Normal appearance. She is not ill-appearing, toxic-appearing or diaphoretic.  Pulmonary:     Effort: Pulmonary effort is normal.  Abdominal:     General: Bowel sounds are normal. There is no distension.     Palpations: Abdomen is soft. There is no mass.     Tenderness: There is no abdominal tenderness. There is no right CVA tenderness, left CVA tenderness, guarding or rebound.     Comments: No suprapubic tenderness   Neurological:     Mental Status: She is alert and oriented to person, place, and time.     Gait: Gait abnormal.       Results for orders placed or performed in visit on 04/05/22  Urine Culture   Specimen: Urine   Urine  Result Value Ref Range   Urine Culture, Routine Final report    Organism ID, Bacteria Comment   Urine Microscopic  Result Value Ref Range   WBC, UA 0-5 0 - 5 /hpf   RBC, Urine None seen 0 - 2 /hpf   Epithelial Cells (non renal) >10 (A) 0 - 10 /hpf   Casts None seen None seen /lpf   Bacteria, UA Moderate (A) None seen/Few  POCT urinalysis dipstick  Result Value Ref Range   Color, UA Yellow    Clarity, UA clear    Glucose, UA Negative Negative   Bilirubin, UA Negative    Ketones, UA Negative    Spec Grav, UA >=1.030 (A) 1.010 - 1.025   Blood, UA Large    pH, UA 6.0 5.0 - 8.0   Protein, UA Negative Negative   Urobilinogen, UA 0.2 0.2 or 1.0 E.U./dL   Nitrite, UA Negative    Leukocytes, UA Negative Negative   Appearance     Odor      Assessment & Plan     Problem List Items Addressed This Visit       Other   Frequent urination - Primary    Acute, worsening  Patient with hx of MS with increasingly difficult gait, concerned that urinary symptoms including increased frequency and  urgency could be related to development of neurogenic bladder U/A did not show active infection however given gross hematuria, will send for culture and microscopy  If all results negative, she would likely benefit from urology referral       Relevant Orders   POCT urinalysis dipstick (Completed)   Urine Culture (Completed)   Urine Microscopic (Completed)   Hematuria    Acute  New problem  Patient reports gross hematuria occasionally for the last week  U/A was negative for nitrites and leukocytes but did show large blood, microscopy collected with no RBCs  Will monitor symptoms and repeat U/A with microscopy in 4-6 weeks  Patient has 5 years smoking history and no occupational exposures to increase suspicion for bladder CA Will send for urine culture in the event that this hematuria is related to bladder irritation from UTI       Relevant Orders   Urine Microscopic (Completed)     No follow-ups on file.      The entirety of the information documented in the History of Present Illness, Review of Systems and Physical Exam were personally obtained by me, Ruxin Ransome Simmons-Robinson. Portions of this information were initially documented by the CMA and reviewed by me for thoroughness and accuracy.     [name], MD  [facility] [phone] (phone) [phone] (fax)  [place]

## 2022-04-06 LAB — URINALYSIS, MICROSCOPIC ONLY
Casts: NONE SEEN /lpf
Epithelial Cells (non renal): >10 /hpf — AB (ref 0–10)
RBC, Urine: NONE SEEN /hpf (ref 0–2)

## 2022-04-07 DIAGNOSIS — R319 Hematuria, unspecified: Secondary | ICD-10-CM | POA: Insufficient documentation

## 2022-04-07 DIAGNOSIS — R350 Frequency of micturition: Secondary | ICD-10-CM | POA: Insufficient documentation

## 2022-04-07 LAB — URINE CULTURE

## 2022-04-07 NOTE — Assessment & Plan Note (Signed)
Acute, worsening  Patient with hx of MS with increasingly difficult gait, concerned that urinary symptoms including increased frequency and urgency could be related to development of neurogenic bladder U/A did not show active infection however given gross hematuria, will send for culture and microscopy  If all results negative, she would likely benefit from urology referral

## 2022-04-07 NOTE — Assessment & Plan Note (Signed)
Acute  New problem  Patient reports gross hematuria occasionally for the last week  U/A was negative for nitrites and leukocytes but did show large blood, microscopy collected with no RBCs  Will monitor symptoms and repeat U/A with microscopy in 4-6 weeks  Patient has 5 years smoking history and no occupational exposures to increase suspicion for bladder CA Will send for urine culture in the event that this hematuria is related to bladder irritation from UTI

## 2022-04-08 ENCOUNTER — Ambulatory Visit: Payer: Self-pay

## 2022-04-08 NOTE — Telephone Encounter (Signed)
Pt called to report that she never received any UTI relief following her visit on Friday. Wants to know if she is going to receive anything at her pharmacy    Chief Complaint: Seen 9/823 with urinary symptoms. Culture was negative. Requesting referral to Urology. Feels kite she is not emptying her bladder. Symptoms: Above Frequency: Last week Pertinent Negatives: Patient denies fever Disposition: '[]'$ ED /'[]'$ Urgent Care (no appt availability in office) / '[]'$ Appointment(In office/virtual)/ '[]'$  [facility]/ '[]'$ Home Care/ '[]'$ Refused Recommended Disposition /'[]'$ [facility]/ '[x]'$  Follow-up with PCP Additional Notes:   Answer Assessment - Initial Assessment Questions 1. SYMPTOM: "What's the main symptom you're concerned about?" (e.g., frequency, incontinence)     Not emptying 2. ONSET: "When did the    start?"     Last week 3. PAIN: "Is there any pain?" If Yes, ask: "How bad is it?" (Scale: 1-10; mild, moderate, severe)     Moderate 4. CAUSE: "What do you think is causing the symptoms?"     Unsure 5. OTHER SYMPTOMS: "Do you have any other symptoms?" (e.g., blood in urine, fever, flank pain, pain with urination)     No 6. PREGNANCY: "Is there any chance you are pregnant?" "When was your last menstrual period?"     No  Protocols used: Urinary Symptoms-A-AH

## 2022-04-11 ENCOUNTER — Ambulatory Visit: Payer: Managed Care, Other (non HMO) | Admitting: Family Medicine

## 2022-04-11 ENCOUNTER — Encounter: Payer: Self-pay | Admitting: Family Medicine

## 2022-04-11 VITALS — BP 116/53 | HR 63 | Temp 97.8°F | Resp 16

## 2022-04-11 DIAGNOSIS — R350 Frequency of micturition: Secondary | ICD-10-CM | POA: Diagnosis not present

## 2022-04-11 DIAGNOSIS — E039 Hypothyroidism, unspecified: Secondary | ICD-10-CM | POA: Diagnosis not present

## 2022-04-11 MED ORDER — LEVOTHYROXINE SODIUM 125 MCG PO TABS: 125.0000 ug | ORAL_TABLET | Freq: Every day | ORAL | 1 refills | Status: DC

## 2022-04-11 MED ORDER — OXYBUTYNIN CHLORIDE ER 5 MG PO TB24: 5.0000 mg | ORAL_TABLET | Freq: Every day | ORAL | 0 refills | Status: DC

## 2022-04-11 NOTE — Assessment & Plan Note (Signed)
Chronic, controlled  Patient requesting refills for synthroid 181mg  Will repeat TSH and T4 today  Refills sent  She will continue current medication

## 2022-04-11 NOTE — Progress Notes (Signed)
Established patient visit  I,Erica Noble,acting as a scribe for [employer], MD.,have documented all relevant documentation on the behalf of Erica Foster, MD,as directed by  Erica Foster, MD while in the presence of Erica Foster, MD.   Patient: Erica Noble   DOB: 1961/03/01   61 y.o. Female  MRN: [id number] Visit Date: 04/11/2022  Today's healthcare provider: [name], MD   Chief Complaint  Patient presents with   follow-up UTI symptoms   Subjective    HPI  Patient comes in today for follow-up urinary symptoms. Patient reports that she is not able to completely empty her bladder. She reports that the frequency has mildly improve but she continues to have urgency and small volume voids. She denies abdominal pain. She continues to see occasional blood in her urine that she describes as very small amounts.   Medications: Outpatient Medications Prior to Visit  Medication Sig   cholecalciferol (VITAMIN D3) 25 MCG (1000 UNIT) tablet Take 1,000 Units by mouth daily.   Cyanocobalamin (VITAMIN B 12 PO) Take by mouth.   [DISCONTINUED] levothyroxine (SYNTHROID) 125 MCG tablet Take 1 tablet (125 mcg total) by mouth daily before breakfast.   No facility-administered medications prior to visit.    Review of Systems  Constitutional:  Negative for chills and fever.  Genitourinary:  Positive for decreased urine volume, frequency, hematuria and urgency.       Objective    BP (!) 116/53 (BP Location: Right Arm, Patient Position: Sitting, Cuff Size: Large)   Pulse 63   Temp 97.8 F (36.6 C) (Oral)   Resp 16    Physical Exam Constitutional:      General: She is not in acute distress.    Appearance: Normal appearance. She is not ill-appearing, toxic-appearing or diaphoretic.  Eyes:     General: No scleral icterus.    Conjunctiva/sclera: Conjunctivae normal.  Cardiovascular:     Rate and Rhythm: Normal rate  and regular rhythm.     Pulses: Normal pulses.     Heart sounds: Normal heart sounds. No murmur heard.    No friction rub. No gallop.  Pulmonary:     Effort: Pulmonary effort is normal. No respiratory distress.     Breath sounds: Normal breath sounds. No wheezing, rhonchi or rales.  Abdominal:     General: Bowel sounds are normal. There is no distension.     Palpations: Abdomen is soft. There is no mass.     Tenderness: There is no abdominal tenderness. There is no right CVA tenderness, left CVA tenderness or guarding.  Skin:    General: Skin is warm.  Neurological:     Mental Status: She is alert.       No results found for any visits on 04/11/22.  Assessment & Plan     Problem List Items Addressed This Visit       Endocrine   Adult hypothyroidism    Chronic, controlled  Patient requesting refills for synthroid 178mg  Will repeat TSH and T4 today  Refills sent  She will continue current medication      Relevant Medications   levothyroxine (SYNTHROID) 125 MCG tablet   Other Relevant Orders   TSH + free T4     Other   Frequent urination - Primary    Mildly improved, new problem  Discussed SE and goal of oxybutynin therapy  Will start '5mg'$  daily XL oxybutynin  Will also submit referral to urology for further evaluation  Discussed neurogenic bladder in setting of MS vs non neurogenic OAB with patient and her husband       Relevant Orders   Ambulatory referral to Urology    Return in about 2 weeks (around 04/25/2022) for urinary symptoms .      I, Erica Foster, MD, have reviewed all documentation for this visit. The documentation on 04/11/22 for the exam, diagnosis, procedures, and orders are all accurate and complete.   Erica Foster, MD  Erica Noble [phone] (phone) [phone] (fax)  [place]

## 2022-04-11 NOTE — Assessment & Plan Note (Signed)
Mildly improved, new problem  Discussed SE and goal of oxybutynin therapy  Will start '5mg'$  daily XL oxybutynin  Will also submit referral to urology for further evaluation  Discussed neurogenic bladder in setting of MS vs non neurogenic OAB with patient and her husband

## 2022-04-12 LAB — TSH+FREE T4
Free T4: 1.26 ng/dL (ref 0.82–1.77)
TSH: 3.54 u[IU]/mL (ref 0.450–4.500)

## 2022-04-18 ENCOUNTER — Ambulatory Visit: Payer: Managed Care, Other (non HMO) | Admitting: Family Medicine

## 2022-04-18 ENCOUNTER — Ambulatory Visit: Payer: Managed Care, Other (non HMO) | Admitting: Dermatology

## 2022-04-23 ENCOUNTER — Other Ambulatory Visit: Payer: Self-pay | Admitting: Obstetrics and Gynecology

## 2022-04-23 DIAGNOSIS — Z1231 Encounter for screening mammogram for malignant neoplasm of breast: Secondary | ICD-10-CM

## 2022-04-25 ENCOUNTER — Ambulatory Visit: Payer: Managed Care, Other (non HMO) | Admitting: Dermatology

## 2022-04-26 ENCOUNTER — Ambulatory Visit: Payer: Managed Care, Other (non HMO) | Admitting: Urology

## 2022-04-30 ENCOUNTER — Telehealth: Payer: Self-pay | Admitting: *Deleted

## 2022-04-30 NOTE — Telephone Encounter (Signed)
Gave completed/signed [facility] attending physicians supplementary statement back to medical records to process for pt.

## 2022-05-01 NOTE — Telephone Encounter (Signed)
Pt [employer] form faxed on 04/30/2022

## 2022-05-02 ENCOUNTER — Ambulatory Visit: Payer: Managed Care, Other (non HMO) | Admitting: Family Medicine

## 2022-05-20 ENCOUNTER — Ambulatory Visit
Admission: RE | Admit: 2022-05-20 | Discharge: 2022-05-20 | Disposition: A | Discharge status: Home / Self Care | Payer: Managed Care, Other (non HMO) | Source: Ambulatory Visit | Attending: Obstetrics and Gynecology | Admitting: Obstetrics and Gynecology

## 2022-05-20 DIAGNOSIS — Z1231 Encounter for screening mammogram for malignant neoplasm of breast: Secondary | ICD-10-CM | POA: Insufficient documentation

## 2022-05-22 ENCOUNTER — Encounter: Payer: Self-pay | Admitting: Neurology

## 2022-05-22 ENCOUNTER — Ambulatory Visit: Payer: Managed Care, Other (non HMO) | Admitting: Dermatology

## 2022-05-22 ENCOUNTER — Ambulatory Visit: Payer: Managed Care, Other (non HMO) | Admitting: Neurology

## 2022-05-22 VITALS — BP 135/78 | HR 65 | Ht 65.0 in | Wt 223.0 lb

## 2022-05-22 DIAGNOSIS — R5381 Other malaise: Secondary | ICD-10-CM | POA: Diagnosis not present

## 2022-05-22 DIAGNOSIS — R269 Unspecified abnormalities of gait and mobility: Secondary | ICD-10-CM | POA: Diagnosis not present

## 2022-05-22 DIAGNOSIS — G35 Multiple sclerosis: Secondary | ICD-10-CM

## 2022-05-22 DIAGNOSIS — Z79899 Other long term (current) drug therapy: Secondary | ICD-10-CM

## 2022-05-22 DIAGNOSIS — R5383 Other fatigue: Secondary | ICD-10-CM

## 2022-05-22 MED ORDER — MODAFINIL 200 MG PO TABS: 200.0000 mg | ORAL_TABLET | Freq: Every day | ORAL | 5 refills | Status: DC

## 2022-05-22 NOTE — Progress Notes (Addendum)
[facility]  PATIENT: Erica Noble DOB: 10-Jun-1961  REFERRING DOCTOR OR PCP: Jennings Books MD (neurology); Miguel Aschoff, MD (PCP)  SOURCE: Patient, notes from Dr. Manuella Ghazi, imaging and lab reports, MRI images personally reviewed.  _________________________________   HISTORICAL  CHIEF COMPLAINT:  Chief Complaint  Patient presents with   Follow-up    Pt with husband, rm 1. She is here for follow up. 2nd dose of the loading dose was 8/16 and next scheduled infusion 08/29/2022 would like to discuss concerns from ocrevus    HISTORY OF PRESENT ILLNESS:  I had the pleasure of seeing your patient, Erica Noble, at the [place] at [facility] for neurologic consultation regarding her multiple sclerosis.  UPDATE 05/22/2022: Her husband feels memory and mobility have worseed since starting the  Ocrevus.  She had her infusion in August 2023.      Currently, she uses a cane around the house and a Rollator outside (chair for long distance).     Her left side feels weaker than the right.   She notes some edema in feet/ankles.   She also has right foot pain saw podiatrist and compression socks recommended.   Arms are strong.   No significant spasticity in legs.   She has some right arm discomfort.     She has urinary urgency and rare accidents but more last year than previous year.   She is not on any bladder medication.    She feels her vision is good.  She has a lot of fatigue, worse as day goes on and with heat.   His seems worse the past year.   She is moody and irritable and sometimes angry.  This seems harder to control.   She has mild cognitive issues with reduced processing and word finding at times.    Reduced focus/attention.  She sleeps well most nights.   She sleeps 6-7 hours of sleep.   She does not snore.     MS History: She is a 61 year old woman who was diagnosed with MS after earlier presenting with diplopia and then having HA and visual change     An MRI was c/w MS.     She had enhancng lesions.   She was referred to Dr, Rochele Raring and was placed on Rebif.   She had a major relapse 6 months later affecting gait/balance and she received 5 days of IV Solu-medrol.   She started to see Dr. Jacqulynn Cadet at [facility] and was placed in the [place] trial and placed on drug x 2 years.   She was in the OLE as well.  Around 2012 or 2013, there were additional foci on MRi and a re-dose was discussed   The next MRI showed no new foci and they decided against futher re-dose.   She has never been on another DMT.  sround 2019 she started a cane and walker.     She was adopted so does not know FH.      Imaging: I personally reviewed the MRI of the brain dated 04/07/2007.  It shows multiple T2/FLAIR hyperintense foci in the periventricular, juxtacortical and deep white matter.  Foci also noted in the left cerebellum, left greater than right middle cerebellar peduncles, left thalamus.  That study also showed enhancement of some of the foci in the hemispheres and 1 small focus in the left cerebellum.  MRI of the brain 09/29/2019 by report by report showed additional foci and atrophy compared to 2008  REVIEW  OF SYSTEMS: Constitutional: No fevers, chills, sweats, or change in appetite Eyes: No visual changes, double vision, eye pain Ear, nose and throat: No hearing loss, ear pain, nasal congestion, sore throat Cardiovascular: No chest pain, palpitations Respiratory:  No shortness of breath at rest or with exertion.   No wheezes GastrointestinaI: No nausea, vomiting, diarrhea, abdominal pain, fecal incontinence Genitourinary:  No dysuria, urinary retention or frequency.  No nocturia. Musculoskeletal:  No neck pain, back pain Integumentary: No rash, pruritus, skin lesions Neurological: as above Psychiatric: No depression at this time.  No anxiety Endocrine: No palpitations, diaphoresis, change in appetite, change in weigh or increased thirst Hematologic/Lymphatic:  No  anemia, purpura, petechiae. Allergic/Immunologic: No itchy/runny eyes, nasal congestion, recent allergic reactions, rashes  ALLERGIES: No Known Allergies  HOME MEDICATIONS:  Current Outpatient Medications:    aspirin 325 MG tablet, Take 325 mg by mouth daily., Disp: , Rfl:    cholecalciferol (VITAMIN D3) 25 MCG (1000 UNIT) tablet, Take 1,000 Units by mouth daily., Disp: , Rfl:    Cyanocobalamin (VITAMIN B 12 PO), Take by mouth., Disp: , Rfl:    levothyroxine (SYNTHROID) 125 MCG tablet, Take 1 tablet (125 mcg total) by mouth daily before breakfast., Disp: 90 tablet, Rfl: 1   modafinil (PROVIGIL) 200 MG tablet, Take 1 tablet (200 mg total) by mouth daily., Disp: 30 tablet, Rfl: 5  PAST MEDICAL HISTORY: Past Medical History:  Diagnosis Date   Allergy 05/26/2009   Allergic rhinitis   Basal cell carcinoma 01/17/2010   left upper lip/Moh's Dr. Lacinda Axon   Cancer [facility])    skin ca   H/O thyroidectomy 2006   History of dysplastic nevus 12/11/2007   mid back/severe   Hyperlipidemia    Multiple sclerosis ([place])    2008 diagnosis    PAST SURGICAL HISTORY: Past Surgical History:  Procedure Laterality Date   BREAST BIOPSY Right 2014   bx/clip- neg   CHOLECYSTECTOMY  2000   history of thyroidectomy  2008    FAMILY HISTORY: Family History  Adopted: Yes  Problem Relation Age of Onset   Breast cancer Neg Hx     SOCIAL HISTORY:  Social History   Socioeconomic History   Marital status: Married    Spouse name: Not on file   Number of children: Not on file   Years of education: Not on file   Highest education level: Not on file  Occupational History   Not on file  Tobacco Use   Smoking status: Former    Types: Cigarettes    Quit date: 07/29/1986    Years since quitting: 35.8   Smokeless tobacco: Never  Vaping Use   Vaping Use: Never used  Substance and Sexual Activity   Alcohol use: No    Comment: occ   Drug use: No   Sexual activity: Not on file  Other Topics Concern    Not on file  Social History Narrative   Caffeine tea 1.5 glasses daily. Education:  BA. Work previous Medical illustrator.   Disability with [facility].   Social Determinants of Health   Financial Resource Strain: Not on file  Food Insecurity: Not on file  Transportation Needs: Not on file  Physical Activity: Not on file  Stress: Not on file  Social Connections: Not on file  Intimate Partner Violence: Not on file     PHYSICAL EXAM  Vitals:   05/22/22 1509  BP: 135/78  Pulse: 65  Weight: 223 lb (101.2 kg)  Height: '5\' 5"'$  (1.651 m)  Body mass index is 37.11 kg/m.   General: The patient is well-developed and well-nourished and in no acute distress  HEENT:  Head is /AT.  Sclera are anicteric.   Skin: Extremities are without rash or  edema.  Neurologic Exam  Mental status: The patient is alert and oriented x 3 at the time of the examination. The patient has apparent normal recent and remote memory, with an apparently normal attention span and concentration ability.   Speech is normal.  Cranial nerves: Extraocular movements are full. Facial strength and sensation were symmetric.  Marland Kitchen No obvious hearing deficits are noted.  Motor:  Muscle bulk is normal.   Tone is increased in legs, left greater than right.  Strength was 4/5 in the legs, a little worse on the left..   Sensory: Sensory testing is intact to pinprick, soft touch and vibration sensation in arms and reduced vibration in legs, relative to the arms.  Coordination: Cerebellar testing reveals good finger-nose-finger and poor heel-to-shin .  Gait and station: She needs support to stand up but can stay standing without support.  Her gait in the room was poor  with very shuffling and she does much better with a walker.  Romberg was negative.  Reflexes: Deep tendon reflexes are symmetric and normal in arms and knees    DIAGNOSTIC DATA (LABS, IMAGING, TESTING) - I reviewed patient records, labs, notes, testing and  imaging myself where available.  Lab Results  Component Value Date   WBC 6.7 01/02/2022   HGB 14.5 01/02/2022   HCT 41.9 01/02/2022   MCV 93 01/02/2022   PLT 214 01/02/2022      Component Value Date/Time   NA 143 01/02/2022 1116   K 4.2 01/02/2022 1116   CL 107 (H) 01/02/2022 1116   CO2 24 01/02/2022 1116   GLUCOSE 101 (H) 01/02/2022 1116   BUN 12 01/02/2022 1116   CREATININE 0.74 01/02/2022 1116   CALCIUM 9.5 01/02/2022 1116   PROT 7.2 01/02/2022 1116   ALBUMIN 4.5 01/02/2022 1116   AST 18 01/02/2022 1116   ALT 19 01/02/2022 1116   ALKPHOS 70 01/02/2022 1116   BILITOT 0.6 01/02/2022 1116   GFRNONAA 82 04/28/2019 1631   GFRAA 95 04/28/2019 1631   Lab Results  Component Value Date   CHOL 178 01/02/2022   HDL 56 01/02/2022   LDLCALC 110 (H) 01/02/2022   TRIG 63 01/02/2022   CHOLHDL 3.2 01/02/2022   Lab Results  Component Value Date   HGBA1C 5.3 01/02/2022   Lab Results  Component Value Date   UXNATFTD32 202 04/28/2019   Lab Results  Component Value Date   TSH 3.540 04/11/2022       ASSESSMENT AND PLAN  Multiple sclerosis (HCC) - Plan: IgG, IgA, IgM, CBC with Differential/Platelet, CD20 B Cells  High risk medication use - Plan: IgG, IgA, IgM, CBC with Differential/Platelet, CD20 B Cells  Gait disturbance  Malaise and fatigue   Continue Ocrevus.   We discussed switching to a BTK inhibitor when they become available as they may also have some benefit in secondary progressive MS.  Right before her next infusion in February, at the end of January, we will check blood work including CD20 count. Modafinil 200 mg daily.   If no benefit consider phentermine or other stimulant Rtc 6 months  40-minute office visit with the majority of the time spent face-to-face for history and physical, discussion/counseling and decision-making.  Additional time with record review and documentation.  Sanjuanita Condrey A. Felecia Shelling, MD, [facility] 82/66/6648, 6:16 PM Certified in  Neurology, Clinical Neurophysiology, Sleep Medicine and Neuroimaging  [facility] [address], [place], [place] [address] [phone]

## 2022-05-23 ENCOUNTER — Telehealth: Payer: Self-pay | Admitting: *Deleted

## 2022-05-23 NOTE — Telephone Encounter (Signed)
Submitted PA modafinil on CMM. Key: [id number]. Waiting on determination from [facility].

## 2022-05-27 NOTE — Telephone Encounter (Signed)
"  Request Reference Number: [id number]. MODAFINIL TAB '200MG'$  is approved through 11/22/2022. Your patient may now fill this prescription and it will be covered."

## 2022-06-24 ENCOUNTER — Other Ambulatory Visit: Payer: Self-pay | Admitting: Family Medicine

## 2022-06-24 DIAGNOSIS — E039 Hypothyroidism, unspecified: Secondary | ICD-10-CM

## 2022-06-24 NOTE — Telephone Encounter (Signed)
Medication Refill - Medication: levothyroxine (SYNTHROID) 125 MCG tablet   Has the patient contacted their pharmacy? No.  (Agent: If no, request that the patient contact the pharmacy for the refill. If patient does not wish to contact the pharmacy document the reason why and proceed with request.) Patient pharmacy moved. Patient has a new pharmacy   Preferred Pharmacy (with phone number or street name):  [id number] - [place], [place] [address] AT [address] Phone: [phone]  Fax: [phone]    Has the patient been seen for an appointment in the last year OR does the patient have an upcoming appointment? Yes.    Agent: Please be advised that RX refills may take up to 3 business days. We ask that you follow-up with your pharmacy.

## 2022-06-25 NOTE — Telephone Encounter (Signed)
Unable to refill per protocol, Rx request is too soon. Last refill 04/11/22 for 90 and 1 refill. Will refuse.  Requested Prescriptions  Pending Prescriptions Disp Refills   levothyroxine (SYNTHROID) 125 MCG tablet 90 tablet 1    Sig: Take 1 tablet (125 mcg total) by mouth daily before breakfast.     Endocrinology:  Hypothyroid Agents Passed - 06/24/2022 11:39 AM      Passed - TSH in normal range and within 360 days    TSH  Date Value Ref Range Status  04/11/2022 3.540 0.450 - 4.500 uIU/mL Final         Passed - Valid encounter within last 12 months    Recent Outpatient Visits           2 months ago Frequent urination   [place], [place], MD   2 months ago Frequent urination   [facility] Garza-Salinas II, Hersey, MD   5 months ago Annual physical exam   [facility] Jerrol Banana., MD   1 year ago Adult hypothyroidism   [facility] Jerrol Banana., MD   1 year ago Annual physical exam   [facility] Jerrol Banana., MD       Future Appointments             In 1 month Nehemiah Massed Monia Sabal, MD [place]

## 2022-07-24 ENCOUNTER — Telehealth: Payer: Self-pay | Admitting: Neurology

## 2022-07-24 NOTE — Telephone Encounter (Signed)
Noted, passed message along to intrafusion

## 2022-07-24 NOTE — Telephone Encounter (Signed)
Jinny Blossom, Nurse Case Manager @ [phone] xt [phone] reports that pt's insurance for 2024 will cover pt's Ocrevus.  She is asking if a letter is received stating otherwise please ignore

## 2022-08-01 ENCOUNTER — Ambulatory Visit: Payer: Managed Care, Other (non HMO) | Admitting: Dermatology

## 2022-08-01 VITALS — BP 114/73 | HR 61

## 2022-08-01 DIAGNOSIS — L578 Other skin changes due to chronic exposure to nonionizing radiation: Secondary | ICD-10-CM

## 2022-08-01 DIAGNOSIS — Z85828 Personal history of other malignant neoplasm of skin: Secondary | ICD-10-CM

## 2022-08-01 DIAGNOSIS — D225 Melanocytic nevi of trunk: Secondary | ICD-10-CM | POA: Diagnosis not present

## 2022-08-01 DIAGNOSIS — D229 Melanocytic nevi, unspecified: Secondary | ICD-10-CM

## 2022-08-01 DIAGNOSIS — D485 Neoplasm of uncertain behavior of skin: Secondary | ICD-10-CM

## 2022-08-01 DIAGNOSIS — L821 Other seborrheic keratosis: Secondary | ICD-10-CM

## 2022-08-01 DIAGNOSIS — L720 Epidermal cyst: Secondary | ICD-10-CM

## 2022-08-01 DIAGNOSIS — G35 Multiple sclerosis: Secondary | ICD-10-CM | POA: Diagnosis not present

## 2022-08-01 DIAGNOSIS — L814 Other melanin hyperpigmentation: Secondary | ICD-10-CM

## 2022-08-01 NOTE — Patient Instructions (Addendum)
Wound Care Instructions  Cleanse wound gently with soap and water once a day then pat dry with clean gauze. Apply a thin coat of Petrolatum (petroleum jelly, "Vaseline") over the wound (unless you have an allergy to this). We recommend that you use a new, sterile tube of Vaseline. Do not pick or remove scabs. Do not remove the yellow or white "healing tissue" from the base of the wound.  Cover the wound with fresh, clean, nonstick gauze and secure with paper tape. You may use Band-Aids in place of gauze and tape if the wound is small enough, but would recommend trimming much of the tape off as there is often too much. Sometimes Band-Aids can irritate the skin.  You should call the office for your biopsy report after 1 week if you have not already been contacted.  If you experience any problems, such as abnormal amounts of bleeding, swelling, significant bruising, significant pain, or evidence of infection, please call the office immediately.  FOR ADULT SURGERY PATIENTS: If you need something for pain relief you may take 1 extra strength Tylenol (acetaminophen) AND 2 Ibuprofen (200mg each) together every 4 hours as needed for pain. (do not take these if you are allergic to them or if you have a reason you should not take them.) Typically, you may only need pain medication for 1 to 3 days.     Due to recent changes in healthcare laws, you may see results of your pathology and/or laboratory studies on MyChart before the doctors have had a chance to review them. We understand that in some cases there may be results that are confusing or concerning to you. Please understand that not all results are received at the same time and often the doctors may need to interpret multiple results in order to provide you with the best plan of care or course of treatment. Therefore, we ask that you please give us 2 business days to thoroughly review all your results before contacting the office for clarification. Should  we see a critical lab result, you will be contacted sooner.   If You Need Anything After Your Visit  If you have any questions or concerns for your doctor, please call our main line at [phone] and press option 4 to reach your doctor's medical assistant. If no one answers, please leave a voicemail as directed and we will return your call as soon as possible. Messages left after 4 pm will be answered the following business day.   You may also send us a message via MyChart. We typically respond to MyChart messages within 1-2 business days.  For prescription refills, please ask your pharmacy to contact our office. Our fax number is [phone].  If you have an urgent issue when the clinic is closed that cannot wait until the next business day, you can page your doctor at the number below.    Please note that while we do our best to be available for urgent issues outside of office hours, we are not available 24/7.   If you have an urgent issue and are unable to reach us, you may choose to seek medical care at your doctor's office, retail clinic, urgent care center, or emergency room.  If you have a medical emergency, please immediately call 911 or go to the emergency department.  Pager Numbers  - Dr. Kowalski: [phone]  - Dr. Moye: [phone]  - Dr. Stewart: [phone]  In the event of inclement weather, please call our main line at   [phone] for an update on the status of any delays or closures.  Dermatology Medication Tips: Please keep the boxes that topical medications come in in order to help keep track of the instructions about where and how to use these. Pharmacies typically print the medication instructions only on the boxes and not directly on the medication tubes.   If your medication is too expensive, please contact our office at [phone] option 4 or send us a message through MyChart.   We are unable to tell what your co-pay for medications will be in  advance as this is different depending on your insurance coverage. However, we may be able to find a substitute medication at lower cost or fill out paperwork to get insurance to cover a needed medication.   If a prior authorization is required to get your medication covered by your insurance company, please allow us 1-2 business days to complete this process.  Drug prices often vary depending on where the prescription is filled and some pharmacies may offer cheaper prices.  The website www.goodrx.com contains coupons for medications through different pharmacies. The prices here do not account for what the cost may be with help from insurance (it may be cheaper with your insurance), but the website can give you the price if you did not use any insurance.  - You can print the associated coupon and take it with your prescription to the pharmacy.  - You may also stop by our office during regular business hours and pick up a GoodRx coupon card.  - If you need your prescription sent electronically to a different pharmacy, notify our office through [place] MyChart or by phone at [phone] option 4.     Si Usted Necesita Algo Despus de Su Visita  Tambin puede enviarnos un mensaje a travs de MyChart. Por lo general respondemos a los mensajes de MyChart en el transcurso de 1 a 2 das hbiles.  Para renovar recetas, por favor pida a su farmacia que se ponga en contacto con nuestra oficina. Nuestro nmero de fax es el [phone].  Si tiene un asunto urgente cuando la clnica est cerrada y que no puede esperar hasta el siguiente da hbil, puede llamar/localizar a su doctor(a) al nmero que aparece a continuacin.   Por favor, tenga en cuenta que aunque hacemos todo lo posible para estar disponibles para asuntos urgentes fuera del horario de oficina, no estamos disponibles las 24 horas del da, los 7 das de la semana.   Si tiene un problema urgente y no puede comunicarse con nosotros, puede  optar por buscar atencin mdica  en el consultorio de su doctor(a), en una clnica privada, en un centro de atencin urgente o en una sala de emergencias.  Si tiene una emergencia mdica, por favor llame inmediatamente al 911 o vaya a la sala de emergencias.  Nmeros de bper  - Dr. Kowalski: [phone]  - Dra. Moye: [phone]  - Dra. Stewart: [phone]  En caso de inclemencias del tiempo, por favor llame a nuestra lnea principal al [phone] para una actualizacin sobre el estado de cualquier retraso o cierre.  Consejos para la medicacin en dermatologa: Por favor, guarde las cajas en las que vienen los medicamentos de uso tpico para ayudarle a seguir las instrucciones sobre dnde y cmo usarlos. Las farmacias generalmente imprimen las instrucciones del medicamento slo en las cajas y no directamente en los tubos del medicamento.   Si su medicamento es muy caro, por favor, pngase en contacto con   nuestra oficina llamando al [phone] y presione la opcin 4 o envenos un mensaje a travs de MyChart.   No podemos decirle cul ser su copago por los medicamentos por adelantado ya que esto es diferente dependiendo de la cobertura de su seguro. Sin embargo, es posible que podamos encontrar un medicamento sustituto a menor costo o llenar un formulario para que el seguro cubra el medicamento que se considera necesario.   Si se requiere una autorizacin previa para que su compaa de seguros cubra su medicamento, por favor permtanos de 1 a 2 das hbiles para completar este proceso.  Los precios de los medicamentos varan con frecuencia dependiendo del lugar de dnde se surte la receta y alguna farmacias pueden ofrecer precios ms baratos.  El sitio web www.goodrx.com tiene cupones para medicamentos de diferentes farmacias. Los precios aqu no tienen en cuenta lo que podra costar con la ayuda del seguro (puede ser ms barato con su seguro), pero el sitio web puede darle el  precio si no utiliz ningn seguro.  - Puede imprimir el cupn correspondiente y llevarlo con su receta a la farmacia.  - Tambin puede pasar por nuestra oficina durante el horario de atencin regular y recoger una tarjeta de cupones de GoodRx.  - Si necesita que su receta se enve electrnicamente a una farmacia diferente, informe a nuestra oficina a travs de MyChart de [place] o por telfono llamando al [phone] y presione la opcin 4.  

## 2022-08-01 NOTE — Progress Notes (Signed)
Follow-Up Visit   Subjective  Erica Noble is a 62 y.o. female who presents for the following: Skin check (Of the face and back - patient hasn't noticed any particular lesions, but husband states that there are a lot of skin lesions on the back that he would like checked).  Patient accompanied by husband who contributes to history.   The following portions of the chart were reviewed this encounter and updated as appropriate:   Tobacco  Allergies  Meds  Problems  Med Hx  Surg Hx  Fam Hx      Review of Systems:  No other skin or systemic complaints except as noted in HPI or Assessment and Plan.  Objective  Well appearing patient in no apparent distress; mood and affect are within normal limits.  A focused examination was performed including the face, chest, back, arms, hands. Relevant physical exam findings are noted in the Assessment and Plan.  R flank 0.9 cm irregular brown macule.      Chest Firm SQ nodule.     Assessment & Plan  Multiple sclerosis [facility]) Internal  Patient had her first Ocrevus infusion in August, but they are unsure if they will continue infusions since patient's husband has noticed a decline in mental status and mobility and they are unsure if the two are related.  Neoplasm of uncertain behavior of skin R flank  Epidermal / dermal shaving  Lesion diameter (cm):  0.9 Informed consent: discussed and consent obtained   Timeout: patient name, date of birth, surgical site, and procedure verified   Procedure prep:  Patient was prepped and draped in usual sterile fashion Prep type:  Isopropyl alcohol Anesthesia: the lesion was anesthetized in a standard fashion   Anesthetic:  1% lidocaine w/ epinephrine 1-100,000 buffered w/ 8.4% NaHCO3 Instrument used: flexible razor blade   Hemostasis achieved with: pressure, aluminum chloride and electrodesiccation   Outcome: patient tolerated procedure well   Post-procedure details: sterile dressing  applied and wound care instructions given   Dressing type: bandage and petrolatum    Anatomic Pathology Report  Epidermal inclusion cyst Chest  Hx of drainage, benign-appearing. Exam most consistent with an epidermal inclusion cyst. Discussed that a cyst is a benign growth that can grow over time and sometimes get irritated or inflamed. Recommend observation if it is not bothersome. Discussed option of surgical excision to remove it if it is growing, symptomatic, or other changes noted. Please call for new or changing lesions so they can be evaluated.    History of Basal Cell Carcinoma of the Skin - No evidence of recurrence today - Recommend regular full body skin exams - Recommend daily broad spectrum sunscreen SPF 30+ to sun-exposed areas, reapply every 2 hours as needed.  - Call if any new or changing lesions are noted between office visits  Seborrheic Keratoses - Stuck-on, waxy, tan-brown papules and/or plaques  - Benign-appearing - Discussed benign etiology and prognosis. - Observe - Call for any changes  Lentigines - Scattered tan macules - Due to sun exposure - Benign-appearing, observe - Recommend daily broad spectrum sunscreen SPF 30+ to sun-exposed areas, reapply every 2 hours as needed. - Call for any changes  Actinic Damage - chronic, secondary to cumulative UV radiation exposure/sun exposure over time - diffuse scaly erythematous macules with underlying dyspigmentation - Recommend daily broad spectrum sunscreen SPF 30+ to sun-exposed areas, reapply every 2 hours as needed.  - Recommend staying in the shade or wearing long sleeves, sun glasses (UVA+UVB  protection) and wide brim hats (4-inch brim around the entire circumference of the hat). - Call for new or changing lesions.  Hemangiomas - Red papules - Discussed benign nature - Observe - Call for any changes  Melanocytic Nevi - Tan-brown and/or pink-flesh-colored symmetric macules and papules - Benign  appearing on exam today - Observation - Call clinic for new or changing moles - Recommend daily use of broad spectrum spf 30+ sunscreen to sun-exposed areas.   Return in about 1 year (around 08/02/2023) for skin check.  Luther Redo, CMA, am acting as scribe for Forest Gleason, MD .  Documentation: I have reviewed the above documentation for accuracy and completeness, and I agree with the above.  Forest Gleason, MD

## 2022-08-05 LAB — ANATOMIC PATHOLOGY REPORT

## 2022-08-06 ENCOUNTER — Telehealth: Payer: Self-pay

## 2022-08-06 DIAGNOSIS — Z1211 Encounter for screening for malignant neoplasm of colon: Secondary | ICD-10-CM

## 2022-08-06 NOTE — Telephone Encounter (Addendum)
Tried calling patient. No answer. LMOM for patient to return call. ----- Message from Alfonso Patten, MD sent at 08/06/2022 10:41 AM EST ----- Comment: Part A-right flank,Skin Biopsy: JUNCTIONAL NEVUS, "Normal Mole" - However, pathologist recommended repeating sample of any dark spot that comes back due to significant sun damage in the area --> recheck at annual FBSE. Call for changes before then.   MAs please call. Thank you!

## 2022-08-06 NOTE — Telephone Encounter (Unsigned)
Copied from [place] [id number]. Topic: Referral - Request for Referral >> Aug 06, 2022  2:29 PM Everette C wrote: Has patient seen PCP for this complaint? No. *If NO, is insurance requiring patient see PCP for this issue before PCP can refer them? Referral for which specialty: Gastroenterology  Preferred provider/office: Patient has no preference but would like them closer to [facility]  Reason for referral: Patient would like a colonoscopy

## 2022-08-07 NOTE — Telephone Encounter (Signed)
Patient advised.

## 2022-08-07 NOTE — Telephone Encounter (Signed)
Referral submitted for gastroenterology   [name], MD  [facility]

## 2022-08-12 ENCOUNTER — Other Ambulatory Visit: Payer: Self-pay | Admitting: *Deleted

## 2022-08-12 ENCOUNTER — Telehealth: Payer: Self-pay | Admitting: Neurology

## 2022-08-12 DIAGNOSIS — G35 Multiple sclerosis: Secondary | ICD-10-CM

## 2022-08-12 DIAGNOSIS — Z79899 Other long term (current) drug therapy: Secondary | ICD-10-CM

## 2022-08-12 NOTE — Telephone Encounter (Signed)
I released lab orders in EPIC so that pt can go to alternate [facility] location to complete.

## 2022-08-12 NOTE — Telephone Encounter (Signed)
Pt spouse is asking for lab orders to be faxed to ([facility])[phone] for upcoming infusion in Feb

## 2022-08-13 ENCOUNTER — Encounter: Payer: Self-pay | Admitting: Dermatology

## 2022-08-15 ENCOUNTER — Telehealth: Payer: Self-pay

## 2022-08-15 NOTE — Telephone Encounter (Signed)
-----  Message from Alfonso Patten, MD sent at 08/06/2022 10:41 AM EST ----- Comment: Part A-right flank,Skin Biopsy: JUNCTIONAL NEVUS, "Normal Mole" - However, pathologist recommended repeating sample of any dark spot that comes back due to significant sun damage in the area --> recheck at annual FBSE. Call for changes before then.   MAs please call. Thank you!

## 2022-08-20 LAB — CBC WITH DIFFERENTIAL/PLATELET
Basophils Absolute: 0.1 10*3/uL (ref 0.0–0.2)
Basos: 1 %
EOS (ABSOLUTE): 0.1 10*3/uL (ref 0.0–0.4)
Eos: 2 %
Hematocrit: 40.1 % (ref 34.0–46.6)
Hemoglobin: 13.7 g/dL (ref 11.1–15.9)
Immature Grans (Abs): 0 10*3/uL (ref 0.0–0.1)
Immature Granulocytes: 0 %
Lymphocytes Absolute: 2 10*3/uL (ref 0.7–3.1)
Lymphs: 33 %
MCH: 32.5 pg (ref 26.6–33.0)
MCHC: 34.2 g/dL (ref 31.5–35.7)
MCV: 95 fL (ref 79–97)
Monocytes Absolute: 0.6 10*3/uL (ref 0.1–0.9)
Monocytes: 9 %
Neutrophils Absolute: 3.4 10*3/uL (ref 1.4–7.0)
Neutrophils: 55 %
Platelets: 215 10*3/uL (ref 150–450)
RBC: 4.21 x10E6/uL (ref 3.77–5.28)
RDW: 12.3 % (ref 11.7–15.4)
WBC: 6.1 10*3/uL (ref 3.4–10.8)

## 2022-08-20 LAB — IGG, IGA, IGM
IgA/Immunoglobulin A, Serum: 289 mg/dL (ref 87–352)
IgG (Immunoglobin G), Serum: 1019 mg/dL (ref 586–1602)
IgM (Immunoglobulin M), Srm: 38 mg/dL (ref 26–217)

## 2022-08-20 LAB — CD20 B CELLS
% CD19-B Cells: 0 % — ABNORMAL LOW (ref 4.6–22.1)
% CD20-B Cells: 0.2 % — ABNORMAL LOW (ref 5.0–22.3)

## 2022-08-21 ENCOUNTER — Telehealth: Payer: Self-pay

## 2022-08-21 NOTE — Telephone Encounter (Addendum)
Spoke with patient regarding results. She verbalized understanding and will continue to keep an eye on area. Denied further questions at this time.    ----- Message from Alfonso Patten, MD sent at 08/06/2022 10:41 AM EST ----- Comment: Part A-right flank,Skin Biopsy: JUNCTIONAL NEVUS, "Normal Mole" - However, pathologist recommended repeating sample of any dark spot that comes back due to significant sun damage in the area --> recheck at annual FBSE. Call for changes before then.   MAs please call. Thank you!

## 2022-10-03 ENCOUNTER — Other Ambulatory Visit: Payer: Self-pay | Admitting: Family Medicine

## 2022-10-03 DIAGNOSIS — E039 Hypothyroidism, unspecified: Secondary | ICD-10-CM

## 2022-10-15 ENCOUNTER — Encounter: Payer: Self-pay | Admitting: *Deleted

## 2022-10-21 ENCOUNTER — Telehealth: Payer: Self-pay

## 2022-10-21 NOTE — Telephone Encounter (Signed)
You had called pt back in January to schedule colonoscopy. Pt had started a new MS medication and was not ready to schedule at that time. She is now ready to schedule. Please call pt when you are available.

## 2022-10-22 ENCOUNTER — Other Ambulatory Visit: Payer: Self-pay | Admitting: *Deleted

## 2022-10-22 ENCOUNTER — Telehealth: Payer: Self-pay | Admitting: *Deleted

## 2022-10-22 DIAGNOSIS — Z1211 Encounter for screening for malignant neoplasm of colon: Secondary | ICD-10-CM

## 2022-10-22 MED ORDER — NA SULFATE-K SULFATE-MG SULF 17.5-3.13-1.6 GM/177ML PO SOLN: 1.0000 | Freq: Once | ORAL | 0 refills | Status: AC

## 2022-10-22 NOTE — Telephone Encounter (Signed)
Colonoscopy schedule 11/14/2022 at [facility] with Dr Marius Ditch

## 2022-10-22 NOTE — Telephone Encounter (Signed)
Gastroenterology Pre-Procedure Review  Request Date: 11/14/2022 Requesting Physician: Dr. Marius Ditch  PATIENT REVIEW QUESTIONS: The patient responded to the following health history questions as indicated:    1. Are you having any GI issues? no 2. Do you have a personal history of Polyps? yes (years ago) 3. Do you have a family history of Colon Cancer or Polyps? no 4. Diabetes Mellitus? no 5. Joint replacements in the past 12 months?no 6. Major health problems in the past 3 months?no 7. Any artificial heart valves, MVP, or defibrillator?no    MEDICATIONS & ALLERGIES:    Patient reports the following regarding taking any anticoagulation/antiplatelet therapy:   Plavix, Coumadin, Eliquis, Xarelto, Lovenox, Pradaxa, Brilinta, or Effient? no Aspirin? yes (325 mg)  Patient confirms/reports the following medications:  Current Outpatient Medications  Medication Sig Dispense Refill   aspirin 325 MG tablet Take 325 mg by mouth daily.     cholecalciferol (VITAMIN D3) 25 MCG (1000 UNIT) tablet Take 1,000 Units by mouth daily.     Cyanocobalamin (VITAMIN B 12 PO) Take by mouth.     levothyroxine (SYNTHROID) 125 MCG tablet TAKE 1 TABLET(125 MCG) BY MOUTH DAILY BEFORE BREAKFAST 90 tablet 0   modafinil (PROVIGIL) 200 MG tablet Take 1 tablet (200 mg total) by mouth daily. 30 tablet 5   No current facility-administered medications for this visit.    Patient confirms/reports the following allergies:  No Known Allergies  No orders of the defined types were placed in this encounter.   AUTHORIZATION INFORMATION Primary Insurance: 1D#: Group #:  Secondary Insurance: 1D#: Group #:  SCHEDULE INFORMATION: Date: 11/14/2022 Time: Location: [facility]

## 2022-11-06 ENCOUNTER — Encounter: Payer: Self-pay | Admitting: Gastroenterology

## 2022-11-06 NOTE — Anesthesia Preprocedure Evaluation (Addendum)
Anesthesia Evaluation  Patient identified by MRN, date of birth, ID band Patient awake    Reviewed: Allergy & Precautions, H&P , NPO status , Patient's Chart, lab work & pertinent test results  Airway Mallampati: II  TM Distance: >3 FB Neck ROM: Full    Dental no notable dental hx.    Pulmonary former smoker   Pulmonary exam normal breath sounds clear to auscultation       Cardiovascular negative cardio ROS Normal cardiovascular exam Rhythm:Regular Rate:Normal     Neuro/Psych Multiple sclerosis, limited ability to walk with help, uses wheelchair  Neuromuscular disease negative neurological ROS  negative psych ROS   GI/Hepatic negative GI ROS, Neg liver ROS,,,  Endo/Other  negative endocrine ROSHypothyroidism  obesity  Renal/GU negative Renal ROS  negative genitourinary   Musculoskeletal negative musculoskeletal ROS (+)    Abdominal   Peds negative pediatric ROS (+)  Hematology negative hematology ROS (+)   Anesthesia Other Findings Progressive multiple sclerosis, limited ability to walk with assistance, uses wheelchair  Allergy Hyperlipidemia  Cancer Basal cell carcinoma  History of dysplastic nevus H/O thyroidectomy     Reproductive/Obstetrics negative OB ROS                             Anesthesia Physical Anesthesia Plan  ASA: 3  Anesthesia Plan: General   Post-op Pain Management:    Induction: Intravenous  PONV Risk Score and Plan:   Airway Management Planned: Natural Airway and Nasal Cannula  Additional Equipment:   Intra-op Plan:   Post-operative Plan:   Informed Consent: I have reviewed the patients History and Physical, chart, labs and discussed the procedure including the risks, benefits and alternatives for the proposed anesthesia with the patient or authorized representative who has indicated his/her understanding and acceptance.     Dental Advisory  Given  Plan Discussed with: Anesthesiologist, CRNA and Surgeon  Anesthesia Plan Comments: (Patient consented for risks of anesthesia including but not limited to:  - adverse reactions to medications - risk of airway placement if required - damage to eyes, teeth, lips or other oral mucosa - nerve damage due to positioning  - sore throat or hoarseness - Damage to heart, brain, nerves, lungs, other parts of body or loss of life  Patient voiced understanding.)       Anesthesia Quick Evaluation

## 2022-11-14 ENCOUNTER — Ambulatory Visit
Admission: RE | Admit: 2022-11-14 | Discharge: 2022-11-14 | Disposition: A | Discharge status: Home / Self Care | Payer: Managed Care, Other (non HMO) | Source: Ambulatory Visit | Attending: Gastroenterology | Admitting: Gastroenterology

## 2022-11-14 ENCOUNTER — Other Ambulatory Visit: Payer: Self-pay

## 2022-11-14 ENCOUNTER — Encounter: Payer: Self-pay | Admitting: Gastroenterology

## 2022-11-14 ENCOUNTER — Ambulatory Visit: Payer: Managed Care, Other (non HMO) | Admitting: Anesthesiology

## 2022-11-14 ENCOUNTER — Encounter
Admission: RE | Disposition: A | Discharge status: Home / Self Care | Payer: Self-pay | Source: Ambulatory Visit | Attending: Gastroenterology

## 2022-11-14 DIAGNOSIS — Z87891 Personal history of nicotine dependence: Secondary | ICD-10-CM | POA: Insufficient documentation

## 2022-11-14 DIAGNOSIS — G35 Multiple sclerosis: Secondary | ICD-10-CM | POA: Diagnosis not present

## 2022-11-14 DIAGNOSIS — E669 Obesity, unspecified: Secondary | ICD-10-CM | POA: Insufficient documentation

## 2022-11-14 DIAGNOSIS — Z6839 Body mass index (BMI) 39.0-39.9, adult: Secondary | ICD-10-CM | POA: Diagnosis not present

## 2022-11-14 DIAGNOSIS — Z993 Dependence on wheelchair: Secondary | ICD-10-CM | POA: Diagnosis not present

## 2022-11-14 DIAGNOSIS — E890 Postprocedural hypothyroidism: Secondary | ICD-10-CM | POA: Insufficient documentation

## 2022-11-14 DIAGNOSIS — Z85828 Personal history of other malignant neoplasm of skin: Secondary | ICD-10-CM | POA: Diagnosis not present

## 2022-11-14 DIAGNOSIS — Z1211 Encounter for screening for malignant neoplasm of colon: Secondary | ICD-10-CM | POA: Insufficient documentation

## 2022-11-14 DIAGNOSIS — K635 Polyp of colon: Secondary | ICD-10-CM | POA: Diagnosis not present

## 2022-11-14 HISTORY — PX: POLYPECTOMY: SHX5525

## 2022-11-14 HISTORY — PX: COLONOSCOPY WITH PROPOFOL: SHX5780

## 2022-11-14 SURGERY — COLONOSCOPY WITH PROPOFOL
Anesthesia: General | Site: Rectum

## 2022-11-14 MED ORDER — PROPOFOL 10 MG/ML IV BOLUS
INTRAVENOUS | Status: DC | PRN
  Administered 2022-11-14: 50 mg via INTRAVENOUS

## 2022-11-14 MED ORDER — PROPOFOL 500 MG/50ML IV EMUL
INTRAVENOUS | Status: DC | PRN
  Administered 2022-11-14: 140 ug/kg/min via INTRAVENOUS

## 2022-11-14 MED ORDER — SODIUM CHLORIDE 0.9 % IV SOLN: INTRAVENOUS | Status: DC

## 2022-11-14 MED ORDER — STERILE WATER FOR IRRIGATION IR SOLN
Status: DC | PRN
  Administered 2022-11-14: 60 mL

## 2022-11-14 MED ORDER — STERILE WATER FOR IRRIGATION IR SOLN
Status: DC | PRN
  Administered 2022-11-14: 150 mL

## 2022-11-14 MED ORDER — LACTATED RINGERS IV SOLN: INTRAVENOUS | Status: DC

## 2022-11-14 MED ORDER — GLYCOPYRROLATE 0.2 MG/ML IJ SOLN
INTRAMUSCULAR | Status: DC | PRN
  Administered 2022-11-14: .2 mg via INTRAVENOUS

## 2022-11-14 SURGICAL SUPPLY — 25 items
CLIP HMST 235XBRD CATH ROT (MISCELLANEOUS) IMPLANT
CLIP RESOLUTION 360 11X235 (MISCELLANEOUS)
ELECT REM PT RETURN 9FT ADLT (ELECTROSURGICAL)
ELECTRODE REM PT RTRN 9FT ADLT (ELECTROSURGICAL) IMPLANT
FCP ESCP3.2XJMB 240X2.8X (MISCELLANEOUS)
FORCEPS BIOP RAD 4 LRG CAP 4 (CUTTING FORCEPS) IMPLANT
FORCEPS BIOP RJ4 240 W/NDL (MISCELLANEOUS)
FORCEPS ESCP3.2XJMB 240X2.8X (MISCELLANEOUS) IMPLANT
GOWN CVR UNV OPN BCK APRN NK (MISCELLANEOUS) ×4 IMPLANT
GOWN ISOL THUMB LOOP REG UNIV (MISCELLANEOUS) ×4
INJECTOR VARIJECT VIN23 (MISCELLANEOUS) IMPLANT
KIT DEFENDO VALVE AND CONN (KITS) IMPLANT
KIT PRC NS LF DISP ENDO (KITS) ×2 IMPLANT
KIT PROCEDURE OLYMPUS (KITS) ×2
MANIFOLD NEPTUNE II (INSTRUMENTS) ×2 IMPLANT
MARKER SPOT ENDO TATTOO 5ML (MISCELLANEOUS) IMPLANT
PROBE APC STR FIRE (PROBE) IMPLANT
RETRIEVER NET ROTH 2.5X230 LF (MISCELLANEOUS) IMPLANT
SNARE COLD EXACTO (MISCELLANEOUS) IMPLANT
SNARE SHORT THROW 13M SML OVAL (MISCELLANEOUS) IMPLANT
SNARE SHORT THROW 30M LRG OVAL (MISCELLANEOUS) IMPLANT
SNARE SNG USE RND 15MM (INSTRUMENTS) IMPLANT
TRAP ETRAP POLY (MISCELLANEOUS) IMPLANT
VARIJECT INJECTOR VIN23 (MISCELLANEOUS)
WATER STERILE IRR 250ML POUR (IV SOLUTION) ×2 IMPLANT

## 2022-11-14 NOTE — Anesthesia Postprocedure Evaluation (Signed)
Anesthesia Post Note  Patient: Erica Noble  Procedure(s) Performed: COLONOSCOPY WITH PROPOFOL (Rectum) POLYPECTOMY (Rectum)  Patient location during evaluation: PACU Anesthesia Type: General Level of consciousness: awake and alert Pain management: pain level controlled Vital Signs Assessment: post-procedure vital signs reviewed and stable Respiratory status: spontaneous breathing, nonlabored ventilation, respiratory function stable and patient connected to nasal cannula oxygen Cardiovascular status: blood pressure returned to baseline and stable Postop Assessment: no apparent nausea or vomiting Anesthetic complications: no   No notable events documented.   Last Vitals:  Vitals:   11/14/22 0834 11/14/22 0845  BP: 116/70 100/61  Pulse: 69 80  Resp: 20 19  Temp: (!) 36.1 C (!) 36.1 C  SpO2: 99% 98%    Last Pain:  Vitals:   11/14/22 0845  TempSrc:   PainSc: 0-No pain                 Jhonnie Aliano C Yara Tomkinson

## 2022-11-14 NOTE — Transfer of Care (Signed)
Immediate Anesthesia Transfer of Care Note  Patient: Erica Noble  Procedure(s) Performed: COLONOSCOPY WITH PROPOFOL (Rectum) POLYPECTOMY (Rectum)  Patient Location: PACU  Anesthesia Type:General  Level of Consciousness: sedated  Airway & Oxygen Therapy: Patient Spontanous Breathing  Post-op Assessment: Report given to RN and Post -op Vital signs reviewed and stable  Post vital signs: Reviewed and stable  Last Vitals:  Vitals Value Taken Time  BP 116/70 11/14/22 0833  Temp 98.4   Pulse 63 11/14/22 0834  Resp 14 11/14/22 0834  SpO2 99 % 11/14/22 0834  Vitals shown include unvalidated device data.  Last Pain:  Vitals:   11/14/22 0718  TempSrc: Temporal  PainSc: 0-No pain         Complications: No notable events documented.

## 2022-11-14 NOTE — H&P (Signed)
Arlyss Repress, MD [address]  [address]  [place], [place] [address]  Main: [phone]  Fax: [phone] Pager: [phone]  Primary Care Physician:  Bosie Clos, MD Primary Gastroenterologist:  Dr. Arlyss Repress  Pre-Procedure History & Physical: HPI:  Erica Noble is a 62 y.o. female is here for an colonoscopy.   Past Medical History:  Diagnosis Date   Allergy 05/26/2009   Allergic rhinitis   Basal cell carcinoma 01/17/2010   left upper lip/Moh's Dr. Adriana Simas   Cancer    skin ca   H/O thyroidectomy 2006   History of dysplastic nevus 12/11/2007   mid back/severe   Hyperlipidemia    Multiple sclerosis    2008 diagnosis    Past Surgical History:  Procedure Laterality Date   BREAST BIOPSY Right 2014   bx/clip- neg   CHOLECYSTECTOMY  2000   history of thyroidectomy  2008    Prior to Admission medications   Medication Sig Start Date End Date Taking? Authorizing Provider  cholecalciferol (VITAMIN D3) 25 MCG (1000 UNIT) tablet Take 1,000 Units by mouth daily.   Yes [provider]  Cyanocobalamin (VITAMIN B 12 PO) Take by mouth.   Yes [provider]  levothyroxine (SYNTHROID) 125 MCG tablet TAKE 1 TABLET(125 MCG) BY MOUTH DAILY BEFORE BREAKFAST 10/03/22  Yes Simmons-Robinson, Makiera, MD  aspirin 325 MG tablet Take 325 mg by mouth daily. Patient not taking: Reported on 11/14/2022    [provider]  modafinil (PROVIGIL) 200 MG tablet Take 1 tablet (200 mg total) by mouth daily. Patient not taking: Reported on 11/06/2022 05/22/22   Asa Lente, MD    Allergies as of 10/22/2022   (No Known Allergies)    Family History  Adopted: Yes  Problem Relation Age of Onset   Breast cancer Neg Hx     Social History   Socioeconomic History   Marital status: Married    Spouse name: Not on file   Number of children: Not on file   Years of education: Not on file   Highest education level: Not on file  Occupational History    Not on file  Tobacco Use   Smoking status: Former    Types: Cigarettes    Quit date: 07/29/1986    Years since quitting: 36.3   Smokeless tobacco: Never  Vaping Use   Vaping Use: Never used  Substance and Sexual Activity   Alcohol use: No    Comment: occ   Drug use: No   Sexual activity: Not on file  Other Topics Concern   Not on file  Social History Narrative   Caffeine tea 1.5 glasses daily. Education:  BA. Work previous Advertising account planner.   Disability with [facility].   Social Determinants of Health   Financial Resource Strain: Not on file  Food Insecurity: Not on file  Transportation Needs: Not on file  Physical Activity: Not on file  Stress: Not on file  Social Connections: Not on file  Intimate Partner Violence: Not on file    Review of Systems: See HPI, otherwise negative ROS  Physical Exam: BP 139/68   Pulse 64   Temp (!) 97 F (36.1 C) (Temporal)   Ht 5\' 5"  (1.651 m)   Wt 106.6 kg   SpO2 100%   BMI 39.11 kg/m  General:   Alert,  pleasant and cooperative in NAD Head:  Normocephalic and atraumatic. Neck:  Supple; no masses or thyromegaly. Lungs:  Clear throughout to auscultation.  Heart:  Regular rate and rhythm. Abdomen:  Soft, nontender and nondistended. Normal bowel sounds, without guarding, and without rebound.   Neurologic:  Alert and  oriented x4;  grossly normal neurologically.  Impression/Plan: Erica Noble is here for an colonoscopy to be performed for colon cancer screening  Risks, benefits, limitations, and alternatives regarding  colonoscopy have been reviewed with the patient.  Questions have been answered.  All parties agreeable.   Lannette Donath, MD  11/14/2022, 7:23 AM

## 2022-11-14 NOTE — Op Note (Signed)
[facility] Gastroenterology Patient Name: Erica Noble Procedure Date: 11/14/2022 8:00 AM MRN: [id number] Account #: [id number] Date of Birth: 07/23/1961 Admit Type: Outpatient Age: 62 Room: [facility] OR ROOM 01 Gender: Female Note Status: Finalized Instrument Name: [id number] Procedure:             Colonoscopy Indications:           Screening for colorectal malignant neoplasm Providers:             Toney Reil MD, MD Referring MD:          Ferdinand Lango. Sullivan Lone, MD (Referring MD) Medicines:             General Anesthesia Complications:         No immediate complications. Estimated blood loss: None. Procedure:             Pre-Anesthesia Assessment:                        - Prior to the procedure, a History and Physical was                         performed, and patient medications and allergies were                         reviewed. The patient is competent. The risks and                         benefits of the procedure and the sedation options and                         risks were discussed with the patient. All questions                         were answered and informed consent was obtained.                         Patient identification and proposed procedure were                         verified by the physician, the nurse, the                         anesthesiologist, the anesthetist and the technician                         in the pre-procedure area in the procedure room in the                         endoscopy suite. Mental Status Examination: alert and                         oriented. Airway Examination: normal oropharyngeal                         airway and neck mobility. Respiratory Examination:                         clear to auscultation. CV Examination: normal.  Prophylactic Antibiotics: The patient does not require                         prophylactic antibiotics. Prior Anticoagulants: The                         patient  has taken no anticoagulant or antiplatelet                         agents. ASA Grade Assessment: III - A patient with                         severe systemic disease. After reviewing the risks and                         benefits, the patient was deemed in satisfactory                         condition to undergo the procedure. The anesthesia                         plan was to use general anesthesia. Immediately prior                         to administration of medications, the patient was                         re-assessed for adequacy to receive sedatives. The                         heart rate, respiratory rate, oxygen saturations,                         blood pressure, adequacy of pulmonary ventilation, and                         response to care were monitored throughout the                         procedure. The physical status of the patient was                         re-assessed after the procedure.                        After obtaining informed consent, the colonoscope was                         passed under direct vision. Throughout the procedure,                         the patient's blood pressure, pulse, and oxygen                         saturations were monitored continuously. The                         Colonoscope was introduced through the anus and  advanced to the the cecum, identified by appendiceal                         orifice and ileocecal valve. The colonoscopy was                         performed without difficulty. The patient tolerated                         the procedure well. The quality of the bowel                         preparation was evaluated using the BBPS [facility] Bowel                         Preparation Scale) with scores of: Right Colon = 3,                         Transverse Colon = 3 and Left Colon = 3 (entire mucosa                         seen well with no residual staining, small fragments                          of stool or opaque liquid). The total BBPS score                         equals 9. The ileocecal valve, appendiceal orifice,                         and rectum were photographed. Findings:      The perianal and digital rectal examinations were normal. Pertinent       negatives include normal sphincter tone and no palpable rectal lesions.      A diminutive polyp was found in the proximal ascending colon. The polyp       was sessile. The polyp was removed with a cold biopsy forceps. Resection       and retrieval were complete.      A 4 mm polyp was found in the sigmoid colon. The polyp was sessile. The       polyp was removed with a cold snare. Resection and retrieval were       complete. Estimated blood loss: none.      The retroflexed view of the distal rectum and anal verge was normal and       showed no anal or rectal abnormalities. Impression:            - One diminutive polyp in the proximal ascending                         colon, removed with a cold biopsy forceps. Resected                         and retrieved.                        - One 4 mm polyp in the sigmoid colon, removed with a  cold snare. Resected and retrieved.                        - The distal rectum and anal verge are normal on                         retroflexion view. Recommendation:        - Discharge patient to home (with spouse).                        - Resume previous diet today.                        - Continue present medications.                        - Await pathology results.                        - Repeat colonoscopy in 7-10 years for surveillance                         based on pathology results. Procedure Code(s):     --- Professional ---                        [phone], Colonoscopy, flexible; with removal of                         tumor(s), polyp(s), or other lesion(s) by snare                         technique                        45380, 59, Colonoscopy, flexible; with  biopsy, single                         or multiple Diagnosis Code(s):     --- Professional ---                        Z12.11, Encounter for screening for malignant neoplasm                         of colon                        D12.2, Benign neoplasm of ascending colon                        D12.5, Benign neoplasm of sigmoid colon CPT copyright 2022 American Medical Association. All rights reserved. The codes documented in this report are preliminary and upon coder review may  be revised to meet current compliance requirements. Dr. Libby Maw Toney Reil MD, MD 11/14/2022 8:32:30 AM This report has been signed electronically. Number of Addenda: 0 Note Initiated On: 11/14/2022 8:00 AM Scope Withdrawal Time: 0 hours 12 minutes 28 seconds  Total Procedure Duration: 0 hours 14 minutes 26 seconds  Estimated Blood Loss:  Estimated blood loss: none.      [facility]

## 2022-11-15 ENCOUNTER — Encounter: Payer: Self-pay | Admitting: Gastroenterology

## 2022-11-18 ENCOUNTER — Encounter: Payer: Self-pay | Admitting: Gastroenterology

## 2022-11-18 LAB — SURGICAL PATHOLOGY

## 2022-12-03 ENCOUNTER — Encounter: Payer: Self-pay | Admitting: Dermatology

## 2022-12-03 ENCOUNTER — Ambulatory Visit: Payer: Managed Care, Other (non HMO) | Admitting: Dermatology

## 2022-12-03 DIAGNOSIS — L729 Follicular cyst of the skin and subcutaneous tissue, unspecified: Secondary | ICD-10-CM

## 2022-12-03 DIAGNOSIS — L905 Scar conditions and fibrosis of skin: Secondary | ICD-10-CM

## 2022-12-03 DIAGNOSIS — L814 Other melanin hyperpigmentation: Secondary | ICD-10-CM

## 2022-12-03 DIAGNOSIS — L720 Epidermal cyst: Secondary | ICD-10-CM | POA: Diagnosis not present

## 2022-12-03 MED ORDER — DOXYCYCLINE MONOHYDRATE 100 MG PO CAPS: 100.0000 mg | ORAL_CAPSULE | Freq: Two times a day (BID) | ORAL | 0 refills | Status: DC

## 2022-12-03 NOTE — Patient Instructions (Addendum)
Start doxycycline 100 mg twice with food twice daily x 2 weeks then decrease to once daily.  Doxycycline should be taken with food to prevent nausea. Do not lay down for 30 minutes after taking. Be cautious with sun exposure and use good sun protection while on this medication. Pregnant women should not take this medication.   Due to recent changes in healthcare laws, you may see results of your pathology and/or laboratory studies on MyChart before the doctors have had a chance to review them. We understand that in some cases there may be results that are confusing or concerning to you. Please understand that not all results are received at the same time and often the doctors may need to interpret multiple results in order to provide you with the best plan of care or course of treatment. Therefore, we ask that you please give [place] 2 business days to thoroughly review all your results before contacting the office for clarification. Should we see a critical lab result, you will be contacted sooner.   If You Need Anything After Your Visit  If you have any questions or concerns for your doctor, please call our main line at [phone] and press option 4 to reach your doctor's medical assistant. If no one answers, please leave a voicemail as directed and we will return your call as soon as possible. Messages left after 4 pm will be answered the following business day.   You may also send [place] a message via MyChart. We typically respond to MyChart messages within 1-2 business days.  For prescription refills, please ask your pharmacy to contact our office. Our fax number is [phone].  If you have an urgent issue when the clinic is closed that cannot wait until the next business day, you can page your doctor at the number below.    Please note that while we do our best to be available for urgent issues outside of office hours, we are not available 24/7.   If you have an urgent issue and are unable to  reach [place], you may choose to seek medical care at your doctor's office, retail clinic, urgent care center, or emergency room.  If you have a medical emergency, please immediately call 911 or go to the emergency department.  Pager Numbers  - Dr. Gwen Pounds: [phone]  - Dr. Neale Burly: [phone]  - Dr. Roseanne Reno: [phone]  In the event of inclement weather, please call our main line at [phone] for an update on the status of any delays or closures.  Dermatology Medication Tips: Please keep the boxes that topical medications come in in order to help keep track of the instructions about where and how to use these. Pharmacies typically print the medication instructions only on the boxes and not directly on the medication tubes.   If your medication is too expensive, please contact our office at [phone] option 4 or send [place] a message through MyChart.   We are unable to tell what your co-pay for medications will be in advance as this is different depending on your insurance coverage. However, we may be able to find a substitute medication at lower cost or fill out paperwork to get insurance to cover a needed medication.   If a prior authorization is required to get your medication covered by your insurance company, please allow [place] 1-2 business days to complete this process.  Drug prices often vary depending on where the prescription is filled and some pharmacies may offer cheaper prices.  The  website www.goodrx.com contains coupons for medications through different pharmacies. The prices here do not account for what the cost may be with help from insurance (it may be cheaper with your insurance), but the website can give you the price if you did not use any insurance.  - You can print the associated coupon and take it with your prescription to the pharmacy.  - You may also stop by our office during regular business hours and pick up a GoodRx coupon card.  - If you need your prescription  sent electronically to a different pharmacy, notify our office through [facility] or by phone at [phone] option 4.

## 2022-12-03 NOTE — Progress Notes (Signed)
   Follow-Up Visit   Subjective  Erica Noble is a 62 y.o. female who presents for the following: cyst at chest that has become painful over past week, no drainage.    The following portions of the chart were reviewed this encounter and updated as appropriate: medications, allergies, medical history  Review of Systems:  No other skin or systemic complaints except as noted in HPI or Assessment and Plan.  Objective  Well appearing patient in no apparent distress; mood and affect are within normal limits.   A focused examination was performed of the following areas: Chest, abdomen  Relevant exam findings are noted in the Assessment and Plan.    Assessment & Plan   EPIDERMAL INCLUSION CYST Exam: 2.0 cm firm subcutaneous nodule with overlying erythema  Benign-appearing. Exam most consistent with an epidermal inclusion cyst. Discussed that a cyst is a benign growth that can grow over time and sometimes get irritated or inflamed. Recommend observation if it is not bothersome. Discussed option of surgical excision to remove it if it is growing, symptomatic, or other changes noted. Please call for new or changing lesions so they can be evaluated.  Treatment plan:  Start doxycycline 100 mg twice with food twice daily x 2 weeks then decrease to once daily.  Doxycycline should be taken with food to prevent nausea. Do not lay down for 30 minutes after taking. Be cautious with sun exposure and use good sun protection while on this medication. Pregnant women should not take this medication.  Will plan to schedule for excision.   SCAR Exam: 1.0 cm violaceous atrophic macule at right posterior flank Benign-appearing.  Observation.  Call clinic for new or changing lesions. Recommend daily broad spectrum sunscreen SPF 30+, reapply every 2 hours as needed. Treatment: Benign- appearing, observe.    LENTIGINES Exam: scattered tan macules Due to sun exposure Treatment Plan: Benign-appearing,  observe. Recommend daily broad spectrum sunscreen SPF 30+ to sun-exposed areas, reapply every 2 hours as needed.  Call for any changes    Return for TBSE, Hx BCC, Hx Dysplastic Nevi.  Anise Salvo, RMA, am acting as scribe for Willeen Niece, MD .   Documentation: I have reviewed the above documentation for accuracy and completeness, and I agree with the above.  Willeen Niece, MD

## 2022-12-05 ENCOUNTER — Ambulatory Visit: Payer: Managed Care, Other (non HMO) | Admitting: Neurology

## 2022-12-07 ENCOUNTER — Ambulatory Visit: Payer: Managed Care, Other (non HMO)

## 2022-12-09 ENCOUNTER — Ambulatory Visit: Payer: Managed Care, Other (non HMO) | Admitting: Family Medicine

## 2022-12-09 ENCOUNTER — Encounter: Payer: Self-pay | Admitting: Family Medicine

## 2022-12-09 VITALS — BP 107/62 | HR 64 | Resp 16 | Wt 222.1 lb

## 2022-12-09 DIAGNOSIS — E039 Hypothyroidism, unspecified: Secondary | ICD-10-CM | POA: Diagnosis not present

## 2022-12-09 DIAGNOSIS — K592 Neurogenic bowel, not elsewhere classified: Secondary | ICD-10-CM

## 2022-12-09 DIAGNOSIS — K5900 Constipation, unspecified: Secondary | ICD-10-CM

## 2022-12-09 MED ORDER — SENNOSIDES 8.6 MG PO TABS: 1.0000 | ORAL_TABLET | Freq: Every day | ORAL | 3 refills | Status: DC

## 2022-12-09 MED ORDER — POLYETHYLENE GLYCOL 3350 17 GM/SCOOP PO POWD: 17.0000 g | Freq: Two times a day (BID) | ORAL | 1 refills | Status: DC | PRN

## 2022-12-09 MED ORDER — LEVOTHYROXINE SODIUM 125 MCG PO TABS: ORAL_TABLET | ORAL | 0 refills | Status: DC

## 2022-12-09 NOTE — Assessment & Plan Note (Signed)
Chronic  Stable  Patient does report feeling more fatigued recently  Will continue synthroid, refills provided for dose  Will collect TSH and free T4 today

## 2022-12-09 NOTE — Progress Notes (Addendum)
I,Joseline E Rosas,acting as a scribe for [employer], MD.,have documented all relevant documentation on the behalf of Ronnald Ramp, MD,as directed by  Ronnald Ramp, MD while in the presence of Ronnald Ramp, MD.   Established patient visit   Patient: Erica Noble   DOB: [birth year]   62 y.o. Female  MRN: [id number] Visit Date: 12/09/2022  Today's healthcare provider: Ronnald Ramp, MD   No chief complaint on file.  Subjective    HPI   Medication Refills  Hypothyroid, follow-up  Lab Results  Component Value Date   TSH 3.540 04/11/2022   TSH 0.125 (L) 01/02/2022   TSH 0.208 (L) 08/17/2021   FREET4 1.26 04/11/2022    Wt Readings from Last 3 Encounters:  12/09/22 222 lb 1.6 oz (100.7 kg)  11/14/22 235 lb (106.6 kg)  05/22/22 223 lb (101.2 kg)    Patient taking Levothyroxine 125 mg daily. Reports that she is only able to take Synthroid brand. She reports excellent compliance with treatment.  Symptoms: No change in energy level Yes constipation  No diarrhea No heat / cold intolerance  No nervousness No palpitations  No weight changes    -----------------------------------------------------------------------------------------    Constipation  Patient reports that Ocrevus started, she and her husband feel that she has not improved They report that she is having decreased memory and will often ask for the date multiple times per day  She reports that she can do 3 days without a bowel movement She reports that she does not eat as much and they report that they don't have the best diet  She reports that eats twice per day  She tries to drink at least 5-6 glasses of water per day  They have prepared delivery meals  She has taken dulcolax  They are concerned that the colonoscopy to change     Medications: Outpatient Medications Prior to Visit  Medication Sig   aspirin EC 81 MG tablet Take 81 mg by mouth  daily.   cholecalciferol (VITAMIN D3) 25 MCG (1000 UNIT) tablet Take 1,000 Units by mouth daily.   Cyanocobalamin (VITAMIN B 12 PO) Take by mouth.   doxycycline (MONODOX) 100 MG capsule Take 1 capsule (100 mg total) by mouth 2 (two) times daily. Take with food as directed   [DISCONTINUED] levothyroxine (SYNTHROID) 125 MCG tablet TAKE 1 TABLET(125 MCG) BY MOUTH DAILY BEFORE BREAKFAST   No facility-administered medications prior to visit.    Review of Systems     Objective    BP 107/62 (BP Location: Left Arm, Patient Position: Sitting, Cuff Size: Large)   Pulse 64   Resp 16   Wt 222 lb 1.6 oz (100.7 kg)   BMI 36.96 kg/m    Physical Exam Vitals reviewed.  Constitutional:      General: She is not in acute distress.    Appearance: Normal appearance. She is obese. She is not ill-appearing, toxic-appearing or diaphoretic.  Eyes:     Conjunctiva/sclera: Conjunctivae normal.  Cardiovascular:     Rate and Rhythm: Normal rate and regular rhythm.     Pulses: Normal pulses.     Heart sounds: Normal heart sounds. No murmur heard.    No friction rub. No gallop.  Pulmonary:     Effort: Pulmonary effort is normal. No respiratory distress.     Breath sounds: Normal breath sounds. No stridor. No wheezing, rhonchi or rales.  Abdominal:     General: Bowel sounds are normal. There is no distension.  Palpations: Abdomen is soft.     Tenderness: There is no abdominal tenderness.  Musculoskeletal:     Right lower leg: No edema.     Left lower leg: No edema.  Skin:    Findings: No erythema or rash.  Neurological:     Mental Status: She is alert and oriented to person, place, and time.     Gait: Gait abnormal.       No results found for any visits on 12/09/22.   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 - Primary    Suspect this is multifactorial and possibly related to MS but also considered dietary  effect with limited eating  Recommended starting daily miralax and senna  Advised that we can do up to twice daily with goal of one soft BM per day        Relevant Orders   Comprehensive metabolic panel     Endocrine   Adult hypothyroidism    Chronic  Stable  Patient does report feeling more fatigued recently  Will continue synthroid, refills provided for dose  Will collect TSH and free T4 today       Relevant Medications   levothyroxine (SYNTHROID) 125 MCG tablet   Other Relevant Orders   TSH + free T4     Return in about 6 months (around 06/11/2023).      The entirety of the information documented in the History of Present Illness, Review of Systems and Physical Exam were personally obtained by me. Portions of this information were initially documented by Hetty Ely, CMA. I, Ronnald Ramp, MD have reviewed the documentation above for thoroughness and accuracy.    Ronnald Ramp, MD  [facility] [phone] (phone) [phone] (fax)  [facility]

## 2022-12-09 NOTE — Assessment & Plan Note (Signed)
Suspect this is multifactorial and possibly related to MS but also considered dietary effect with limited eating  Recommended starting daily miralax and senna  Advised that we can do up to twice daily with goal of one soft BM per day

## 2022-12-10 LAB — COMPREHENSIVE METABOLIC PANEL
ALT: 17 IU/L (ref 0–32)
AST: 18 IU/L (ref 0–40)
Albumin/Globulin Ratio: 1.8 (ref 1.2–2.2)
Albumin: 4.2 g/dL (ref 3.9–4.9)
Alkaline Phosphatase: 83 IU/L (ref 44–121)
BUN/Creatinine Ratio: 16 (ref 12–28)
BUN: 14 mg/dL (ref 8–27)
Bilirubin Total: 0.4 mg/dL (ref 0.0–1.2)
CO2: 24 mmol/L (ref 20–29)
Calcium: 9.3 mg/dL (ref 8.7–10.3)
Chloride: 102 mmol/L (ref 96–106)
Creatinine, Ser: 0.85 mg/dL (ref 0.57–1.00)
Globulin, Total: 2.4 g/dL (ref 1.5–4.5)
Glucose: 101 mg/dL — ABNORMAL HIGH (ref 70–99)
Potassium: 4.2 mmol/L (ref 3.5–5.2)
Sodium: 142 mmol/L (ref 134–144)
Total Protein: 6.6 g/dL (ref 6.0–8.5)
eGFR: 78 mL/min/{1.73_m2} (ref >59)

## 2022-12-10 LAB — TSH+FREE T4
Free T4: 1.27 ng/dL (ref 0.82–1.77)
TSH: 3.87 u[IU]/mL (ref 0.450–4.500)

## 2022-12-12 ENCOUNTER — Telehealth: Payer: Self-pay | Admitting: Family Medicine

## 2022-12-12 DIAGNOSIS — E039 Hypothyroidism, unspecified: Secondary | ICD-10-CM

## 2022-12-12 MED ORDER — SYNTHROID 125 MCG PO TABS: 125.0000 ug | ORAL_TABLET | Freq: Every day | ORAL | 3 refills | Status: DC

## 2022-12-12 NOTE — Telephone Encounter (Signed)
Brand name prescription has been sent to pharmacy

## 2022-12-12 NOTE — Telephone Encounter (Signed)
Patient states that the most recent refill for levothyroxine (SYNTHROID) 125 MCG tablet was for the generic and patient was told by her surgeon to only take the name brand Synthroid. Please advise if rx can be resent for name brand only. Please follow up with patient.

## 2022-12-12 NOTE — Telephone Encounter (Signed)
Patient requesting brand name Synthroid.

## 2022-12-20 ENCOUNTER — Encounter: Payer: Self-pay | Admitting: Neurology

## 2022-12-20 ENCOUNTER — Ambulatory Visit: Payer: Managed Care, Other (non HMO) | Admitting: Neurology

## 2022-12-20 VITALS — BP 128/76 | Ht 65.0 in | Wt 225.0 lb

## 2022-12-20 DIAGNOSIS — G35 Multiple sclerosis: Secondary | ICD-10-CM

## 2022-12-20 DIAGNOSIS — R269 Unspecified abnormalities of gait and mobility: Secondary | ICD-10-CM

## 2022-12-20 DIAGNOSIS — Z79899 Other long term (current) drug therapy: Secondary | ICD-10-CM

## 2022-12-20 DIAGNOSIS — R5381 Other malaise: Secondary | ICD-10-CM

## 2022-12-20 DIAGNOSIS — G912 (Idiopathic) normal pressure hydrocephalus: Secondary | ICD-10-CM

## 2022-12-20 DIAGNOSIS — G35D Multiple sclerosis, unspecified: Secondary | ICD-10-CM

## 2022-12-20 DIAGNOSIS — R5383 Other fatigue: Secondary | ICD-10-CM

## 2022-12-20 NOTE — Progress Notes (Signed)
[facility]  PATIENT: Erica Noble DOB: [birth year]  REFERRING DOCTOR OR PCP: Cristopher Peru MD (neurology); Julieanne Manson, MD (PCP)  SOURCE: Patient, notes from Dr. Sherryll Burger, imaging and lab reports, MRI images personally reviewed.  _________________________________   HISTORICAL  CHIEF COMPLAINT:  Chief Complaint  Patient presents with   Follow-up    Rm 11, with husband Onalee Hua, f/u MS, uses a cane when walking, doesn't feel that ocrevus is working, more frequent urination with increased incontinence episodes of bowel and bladder. Will also have constipation at times    HISTORY OF PRESENT ILLNESS:  I had the pleasure of seeing your patient, Erica Noble, at the [facility] for neurologic consultation regarding her multiple sclerosis.  UPDATE 12/20/2022: She is doing worse with more gait difficulties/shuffling and more dizziness -- husband feels worse since starting Ocrevus August 2023.   Her husband feels memory and mobility have worseed since starting the  Ocrevus.  She had her infusion in August 2023.      Currently, she uses a cane around the house and a Rollator outside (chair for long distance).  Her gait is shuffling and this has worsened.    Her left side feels weaker than the right.   She notes some edema in feet/ankles.   She also has right foot pain saw podiatrist and compression socks recommended.   Arms are strong.   No significant spasticity in legs.   She has some right arm discomfort.       She has urinary urgency/frequency and rare accidents but more last year than previous year.   She is not on any bladder medication.      She feels her vision is good.  She has a lot of fatigue, worse as day goes on and with heat.   His seems worse the past year.   She is moody and irritable and sometimes angry.  This seems harder to control.   She has mild cognitive issues with reduced processing and word finding at times.    Reduced  focus/attention.  She sleeps well most nights.   She sleeps 6-7 hours of sleep.   She does not snore.     She has a cyst on her abdomen that became infected --it was treated with doxycycline and its looking better now  MS History: She was diagnosed with MS in 2008 after earlier presenting with diplopia and then having HA and visual change    An MRI was c/w MS.     She had enhancng lesions.   She was referred to Dr, Ellene Route and was placed on Rebif.   She had a major relapse 6 months later affecting gait/balance and she received 5 days of IV Solu-medrol.   She started to see Dr. Leotis Shames at [facility] and was placed in the [place] trial and placed on drug x 2 years.   She was in the OLE as well.  Around 2012 or 2013, there were additional foci on MRi and a re-dose was discussed   The next MRI showed no new foci and they decided against futher re-dose.   Around 2019 she started a cane and walker.   Ocrevus was started in fall 2023.  She was adopted so does not know FH.      Imaging: MRI of the brain dated 04/07/2007.  It shows multiple T2/FLAIR hyperintense foci in the periventricular, juxtacortical and deep white matter.  Foci also noted in the left cerebellum, left greater  than right middle cerebellar peduncles, left thalamus.  That study also showed enhancement of some of the foci in the hemispheres and 1 small focus in the left cerebellum.  MRI of the brain 09/29/2019 by report by report showed additional foci and atrophy compared to 2008  MRI 01/17/2022 showed subtle T2 hyperintense signal centrally adjacent to C2-C3, and towards the left adjacent to C3-C4 through C4-C5. Although not included in the report in 2021, these foci appear to be present on the 2021 MRI.Marland Kitchen    Some DJD at C4C5 ad C5C6  MRI brain 01/17/2022 showed Moderate generalized cortical atrophy.  The ventriculomegaly is mildly out of proportion to the extent of atrophy and there could be an element of communicating hydrocephalus.  However,  the ventricle size is stable compared to the 2021 MRI.     Extensive T2/FLAIR hyperintense foci in the hemispheres consistent with chronic demyelinating plaque associated with multiple sclerosis.  This is stable compared to the 2021 MRI.     No acute findings.  Normal enhancement pattern.    REVIEW OF SYSTEMS: Constitutional: No fevers, chills, sweats, or change in appetite Eyes: No visual changes, double vision, eye pain Ear, nose and throat: No hearing loss, ear pain, nasal congestion, sore throat Cardiovascular: No chest pain, palpitations Respiratory:  No shortness of breath at rest or with exertion.   No wheezes GastrointestinaI: No nausea, vomiting, diarrhea, abdominal pain, fecal incontinence Genitourinary:  No dysuria, urinary retention or frequency.  No nocturia. Musculoskeletal:  No neck pain, back pain Integumentary: No rash, pruritus, skin lesions Neurological: as above Psychiatric: No depression at this time.  No anxiety Endocrine: No palpitations, diaphoresis, change in appetite, change in weigh or increased thirst.  She had surgery for Hashimoto's thyroiditis and is on Synthroid Hematologic/Lymphatic:  No anemia, purpura, petechiae. Allergic/Immunologic: No itchy/runny eyes, nasal congestion, recent allergic reactions, rashes  ALLERGIES: Allergies  Allergen Reactions   Epinephrine Shortness Of Breath and Swelling    During dental procedure     HOME MEDICATIONS:  Current Outpatient Medications:    cholecalciferol (VITAMIN D3) 25 MCG (1000 UNIT) tablet, Take 1,000 Units by mouth daily., Disp: , Rfl:    Cyanocobalamin (VITAMIN B 12 PO), Take by mouth., Disp: , Rfl:    doxycycline (MONODOX) 100 MG capsule, Take 1 capsule (100 mg total) by mouth 2 (two) times daily. Take with food as directed, Disp: 60 capsule, Rfl: 0   polyethylene glycol powder (GLYCOLAX/MIRALAX) 17 GM/SCOOP powder, Take 17 g by mouth 2 (two) times daily as needed for mild constipation or moderate  constipation., Disp: 3350 g, Rfl: 1   SYNTHROID 125 MCG tablet, Take 1 tablet (125 mcg total) by mouth daily before breakfast., Disp: 90 tablet, Rfl: 3   aspirin EC 81 MG tablet, Take 81 mg by mouth daily. (Patient not taking: Reported on 12/20/2022), Disp: , Rfl:    senna (SENOKOT) 8.6 MG tablet, Take 1 tablet (8.6 mg total) by mouth daily. (Patient not taking: Reported on 12/20/2022), Disp: 60 tablet, Rfl: 3  PAST MEDICAL HISTORY: Past Medical History:  Diagnosis Date   Allergy 05/26/2009   Allergic rhinitis   Basal cell carcinoma 01/17/2010   left upper lip/Moh's Dr. Adriana Simas   Cancer [facility])    skin ca   H/O thyroidectomy 2006   History of dysplastic nevus 12/11/2007   mid back/severe   Hyperlipidemia    Multiple sclerosis (HCC)    2008 diagnosis    PAST SURGICAL HISTORY: Past Surgical History:  Procedure Laterality  Date   BREAST BIOPSY Right 2014   bx/clip- neg   CHOLECYSTECTOMY  2000   COLONOSCOPY WITH PROPOFOL N/A 11/14/2022   Procedure: COLONOSCOPY WITH PROPOFOL;  Surgeon: Toney Reil, MD;  [facility];  Service: Endoscopy;  Laterality: N/A;   history of thyroidectomy  2008   POLYPECTOMY  11/14/2022   Procedure: POLYPECTOMY;  Surgeon: Toney Reil, MD;  Location: [facility];  Service: Endoscopy;;    FAMILY HISTORY: Family History  Adopted: Yes  Problem Relation Age of Onset   Breast cancer Neg Hx     SOCIAL HISTORY:  Social History   Socioeconomic History   Marital status: Married    Spouse name: Not on file   Number of children: Not on file   Years of education: Not on file   Highest education level: Bachelor's degree (e.g., BA, AB, BS)  Occupational History   Not on file  Tobacco Use   Smoking status: Former    Types: Cigarettes    Quit date: 07/29/1986    Years since quitting: 36.4   Smokeless tobacco: Never  Vaping Use   Vaping Use: Never used  Substance and Sexual Activity   Alcohol use: No    Comment: occ    Drug use: No   Sexual activity: Not on file  Other Topics Concern   Not on file  Social History Narrative   Caffeine tea 1.5 glasses daily. Education:  BA. Work previous Advertising account planner.   Disability with [facility].   Right handed   Lives at home with husband   Social Determinants of Health   Financial Resource Strain: Low Risk  (12/05/2022)   Overall Financial Resource Strain (CARDIA)    Difficulty of Paying Living Expenses: Not very hard  Food Insecurity: Food Insecurity Present (12/05/2022)   Hunger Vital Sign    Worried About Running Out of Food in the Last Year: Sometimes true    Ran Out of Food in the Last Year: Sometimes true  Transportation Needs: No Transportation Needs (12/05/2022)   PRAPARE - Administrator, Civil Service (Medical): No    Lack of Transportation (Non-Medical): No  Physical Activity: Unknown (12/05/2022)   Exercise Vital Sign    Days of Exercise per Week: Patient declined    Minutes of Exercise per Session: Not on file  Stress: No Stress Concern Present (12/05/2022)   [employer] of Occupational Health - Occupational Stress Questionnaire    Feeling of Stress : Only a little  Social Connections: Unknown (12/05/2022)   Social Connection and Isolation Panel [NHANES]    Frequency of Communication with Friends and Family: Patient declined    Frequency of Social Gatherings with Friends and Family: Patient declined    Attends Religious Services: Patient declined    Database administrator or Organizations: Patient declined    Attends Banker Meetings: Not on file    Marital Status: Married  Intimate Partner Violence: Not on file     PHYSICAL EXAM  Vitals:   12/20/22 1134  BP: 128/76  Weight: 225 lb (102.1 kg)  Height: 5\' 5"  (1.651 m)    Body mass index is 37.44 kg/m.   General: The patient is well-developed and well-nourished and in no acute distress  HEENT:  Head is [place]/AT.  Sclera are anicteric.   Skin: Extremities are  without rash or  edema.  Neurologic Exam  Mental status: The patient is alert and oriented x 3 at the time  of the examination. The patient has apparent normal recent and remote memory, with an apparently normal attention span and concentration ability.   Speech is normal.  Cranial nerves: Extraocular movements are full. Facial strength and sensation were symmetric.  Marland Kitchen No obvious hearing deficits are noted.  Motor:  Muscle bulk is normal.   Tone is increased in legs, left greater than right.  Strength was 4-4+/5 in the legs, a little worse on the left compared to the right..   Sensory: Sensory testing is intact to pinprick, soft touch and vibration sensation in arms and reduced vibration in legs, relative to the arms.  Coordination: Cerebellar testing reveals good finger-nose-finger and poor heel-to-shin .  Gait and station: She needs support to stand up but can stay standing without support.  Her gait in the room was poor  with markedly reduced stride, magnetic shuffling .  Romberg was negative.  Reflexes: Deep tendon reflexes are symmetric and normal in arms and knees    DIAGNOSTIC DATA (LABS, IMAGING, TESTING) - I reviewed patient records, labs, notes, testing and imaging myself where available.  Lab Results  Component Value Date   WBC 6.1 08/16/2022   HGB 13.7 08/16/2022   HCT 40.1 08/16/2022   MCV 95 08/16/2022   PLT 215 08/16/2022      Component Value Date/Time   NA 142 12/09/2022 1634   K 4.2 12/09/2022 1634   CL 102 12/09/2022 1634   CO2 24 12/09/2022 1634   GLUCOSE 101 (H) 12/09/2022 1634   BUN 14 12/09/2022 1634   CREATININE 0.85 12/09/2022 1634   CALCIUM 9.3 12/09/2022 1634   PROT 6.6 12/09/2022 1634   ALBUMIN 4.2 12/09/2022 1634   AST 18 12/09/2022 1634   ALT 17 12/09/2022 1634   ALKPHOS 83 12/09/2022 1634   BILITOT 0.4 12/09/2022 1634   GFRNONAA 82 04/28/2019 1631   GFRAA 95 04/28/2019 1631   Lab Results  Component Value Date   CHOL 178 01/02/2022    HDL 56 01/02/2022   LDLCALC 110 (H) 01/02/2022   TRIG 63 01/02/2022   CHOLHDL 3.2 01/02/2022   Lab Results  Component Value Date   HGBA1C 5.3 01/02/2022   Lab Results  Component Value Date   VITAMINB12 942 04/28/2019   Lab Results  Component Value Date   TSH 3.870 12/09/2022       ASSESSMENT AND PLAN  Multiple sclerosis (HCC)  High risk medication use  Gait disturbance  Malaise and fatigue  Normal pressure hydrocephalus (HCC)   We had a long conversation about the natural history of MS.  For now she will stay on Ocrevus though she is not sure she wants to stay on it long-term.  She feels she is no better on the medication that she was off the medication.  We discussed Mavenclad as another option.  Although the Ocrevus may have some more benefit with the inflammatory aspects of MS, it is unclear if it would have any benefit for the more progressive aspects.   Her gait has worsened over the last 2 to 3 years.  MRI shows possible normal pressure hydrocephalus --- the third and lateral ventricles are enlarged out of proportion to the extent of atrophy and the sylvian fissures are widened while there is crowding at the vertex .  I discussed with her and her husband that patients with MS's can also get all other disorders and that her shuffling gait could be due to combination of the 2 processes.  I recommend a  high-volume lumbar puncture to further evaluate.  She would prefer not to do a lumbar puncture but will give this further thought. Rtc 6 months  43-minute office visit with the majority of the time spent face-to-face for history and physical, discussion/counseling and decision-making.  Additional time with record review and documentation.     Andriy Sherk A. Epimenio Foot, MD, [facility] 12/20/2022, 3:18 PM Certified in Neurology, Clinical Neurophysiology, Sleep Medicine and Neuroimaging  [facility] [address] [place], [place] [address] [phone]

## 2023-01-13 ENCOUNTER — Encounter: Payer: Managed Care, Other (non HMO) | Admitting: Dermatology

## 2023-01-27 ENCOUNTER — Encounter: Payer: Managed Care, Other (non HMO) | Admitting: Dermatology

## 2023-02-05 ENCOUNTER — Ambulatory Visit: Payer: Managed Care, Other (non HMO) | Admitting: Dermatology

## 2023-02-05 DIAGNOSIS — L720 Epidermal cyst: Secondary | ICD-10-CM | POA: Diagnosis not present

## 2023-02-05 DIAGNOSIS — D485 Neoplasm of uncertain behavior of skin: Secondary | ICD-10-CM

## 2023-02-05 NOTE — Patient Instructions (Addendum)
Wound Care Instructions for After Surgery  On the day following your surgery, you should begin doing daily dressing changes until your sutures are removed: Remove the bandage. Cleanse the wound gently with soap and water.  Make sure you then dry the skin surrounding the wound completely or the tape will not stick to the skin. Do not use cotton balls on the wound. After the wound is clean and dry, apply the ointment (either prescription antibiotic prescribed by your doctor or plain Vaseline if nothing was prescribed) gently with a Q-tip. If you are using a bandaid to cover: Apply a bandaid large enough to cover the entire wound. If you do not have a bandaid large enough to cover the wound OR if you are sensitive to bandaid adhesive: Cut a non-stick pad (such as Telfa) to fit the size of the wound.  Cover the wound with the non-stick pad. If the wound is draining, you may want to add a small amount of gauze on top of the non-stick pad for a little added compression to the area. Use tape to seal the area completely.  For the next 1-2 weeks: Be sure to keep the wound moist with ointment 24/7 to ensure best healing. If you are unable to cover the wound with a bandage to hold the ointment in place, you may need to reapply the ointment several times a day. Do not bend over or lift heavy items to reduce the chance of elevated blood pressure to the wound. Do not participate in particularly strenuous activities.  Below is a list of dressing supplies you might need.  Cotton-tipped applicators - Q-tips Gauze pads (2x2 and/or 4x4) - All-Purpose Sponges New and clean tube of petroleum jelly (Vaseline) OR prescription antibiotic ointment if prescribed Either a bandaid large enough to cover the entire wound OR non-stick dressing material (Telfa) and Tape (Paper or Hypafix)  FOR ADULT SURGERY PATIENTS: If you need something for pain relief, you may take 1 extra strength Tylenol (acetaminophen) and 2  ibuprofen (200 mg) together every 4 hours as needed. (Do not take these medications if you are allergic to them or if you know you cannot take them for any other reason). Typically you may only need pain medication for 1-3 days.   Comments on the Post-Operative Period Slight swelling and redness often appear around the wound. This is normal and will disappear within several days following the surgery. The healing wound will drain a brownish-red-yellow discharge during healing. This is a normal phase of wound healing. As the wound begins to heal, the drainage may increase in amount. Again, this drainage is normal. Notify us if the drainage becomes persistently bloody, excessively swollen, or intensely painful or develops a foul odor or red streaks.  The healing wound will also typically be itchy. This is normal. If you have severe or persistent pain, Notify us if the discomfort is severe or persistent. Avoid alcoholic beverages when taking pain medicine.  In Case of Wound Hemorrhage A wound hemorrhage is when the bandage suddenly becomes soaked with bright red blood and flows profusely. If this happens, sit down or lie down with your head elevated. If the wound has a dressing on it, do not remove the dressing. Apply pressure to the existing gauze. If the wound is not covered, use a gauze pad to apply pressure and continue applying the pressure for 20 minutes without peeking. DO NOT COVER THE WOUND WITH A LARGE TOWEL OR WASH CLOTH. Release your hand from the   wound site but do not remove the dressing. If the bleeding has stopped, gently clean around the wound. Leave the dressing in place for 24 hours if possible. This wait time allows the blood vessels to close off so that you do not spark a new round of bleeding by disrupting the newly clotted blood vessels with an immediate dressing change. If the bleeding does not subside, continue to hold pressure for 40 minutes. If bleeding continues, page your  physician, contact an After Hours clinic or go to the Emergency Room.  Due to recent changes in healthcare laws, you may see results of your pathology and/or laboratory studies on MyChart before the doctors have had a chance to review them. We understand that in some cases there may be results that are confusing or concerning to you. Please understand that not all results are received at the same time and often the doctors may need to interpret multiple results in order to provide you with the best plan of care or course of treatment. Therefore, we ask that you please give us 2 business days to thoroughly review all your results before contacting the office for clarification. Should we see a critical lab result, you will be contacted sooner.   If You Need Anything After Your Visit  If you have any questions or concerns for your doctor, please call our main line at [phone] and press option 4 to reach your doctor's medical assistant. If no one answers, please leave a voicemail as directed and we will return your call as soon as possible. Messages left after 4 pm will be answered the following business day.   You may also send us a message via MyChart. We typically respond to MyChart messages within 1-2 business days.  For prescription refills, please ask your pharmacy to contact our office. Our fax number is [phone].  If you have an urgent issue when the clinic is closed that cannot wait until the next business day, you can page your doctor at the number below.    Please note that while we do our best to be available for urgent issues outside of office hours, we are not available 24/7.   If you have an urgent issue and are unable to reach us, you may choose to seek medical care at your doctor's office, retail clinic, urgent care center, or emergency room.  If you have a medical emergency, please immediately call 911 or go to the emergency department.  Pager Numbers  - Dr. Kowalski:  [phone]  - Dr. Moye: [phone]  - Dr. Stewart: [phone]  In the event of inclement weather, please call our main line at [phone] for an update on the status of any delays or closures.  Dermatology Medication Tips: Please keep the boxes that topical medications come in in order to help keep track of the instructions about where and how to use these. Pharmacies typically print the medication instructions only on the boxes and not directly on the medication tubes.   If your medication is too expensive, please contact our office at [phone] option 4 or send us a message through MyChart.   We are unable to tell what your co-pay for medications will be in advance as this is different depending on your insurance coverage. However, we may be able to find a substitute medication at lower cost or fill out paperwork to get insurance to cover a needed medication.   If a prior authorization is required to get your medication covered by   your insurance company, please allow us 1-2 business days to complete this process.  Drug prices often vary depending on where the prescription is filled and some pharmacies may offer cheaper prices.  The website www.goodrx.com contains coupons for medications through different pharmacies. The prices here do not account for what the cost may be with help from insurance (it may be cheaper with your insurance), but the website can give you the price if you did not use any insurance.  - You can print the associated coupon and take it with your prescription to the pharmacy.  - You may also stop by our office during regular business hours and pick up a GoodRx coupon card.  - If you need your prescription sent electronically to a different pharmacy, notify our office through [facility] MyChart or by phone at [phone] option 4.     

## 2023-02-05 NOTE — Progress Notes (Signed)
   Follow-Up Visit   Subjective  Erica Noble is a 62 y.o. female who presents for the following: Excision of cyst   The following portions of the chart were reviewed this encounter and updated as appropriate: medications, allergies, medical history  Review of Systems:  No other skin or systemic complaints except as noted in HPI or Assessment and Plan.  Objective  Well appearing patient in no apparent distress; mood and affect are within normal limits.  A focused examination was performed of the following areas: abdomen Relevant physical exam findings are noted in the Assessment and Plan.   central upper abdomen 1.1 cm firm subcutaneous nodule    Assessment & Plan   Neoplasm of uncertain behavior of skin central upper abdomen  Skin excision  Lesion length (cm):  1.1 Lesion width (cm):  1.1 Margin per side (cm):  0.1 Total excision diameter (cm):  1.3 Informed consent: discussed and consent obtained   Timeout: patient name, date of birth, surgical site, and procedure verified   Procedure prep:  Patient was prepped and draped in usual sterile fashion Prep type:  Povidone-iodine Anesthesia: the lesion was anesthetized in a standard fashion   Anesthetic:  1% lidocaine w/ epinephrine 1-100,000 buffered w/ 8.4% NaHCO3 (6 cc lido w/epi, 6 cc bupivicaine) Instrument used: #15 blade   Hemostasis achieved with: pressure and electrodesiccation   Outcome: patient tolerated procedure well with no complications    Skin repair Complexity:  Intermediate Final length (cm):  2.8 Informed consent: discussed and consent obtained   Reason for type of repair: reduce tension to allow closure, reduce the risk of dehiscence, infection, and necrosis, reduce subcutaneous dead space and avoid a hematoma, preserve normal anatomical and functional relationships and enhance both functionality and cosmetic results   Undermining: edges undermined   Subcutaneous layers (deep stitches):  Suture  size:  4-0 Suture type: Vicryl (polyglactin 910)   Subcutaneous suture technique: inverted dermal. Fine/surface layer approximation (top stitches):  Suture size:  4-0 Suture type: nylon   Stitches: simple interrupted   Suture removal (days):  7 Hemostasis achieved with: suture Outcome: patient tolerated procedure well with no complications   Post-procedure details: sterile dressing applied and wound care instructions given   Dressing type: pressure dressing (Mupirocin ointment)    Specimen 1 - Surgical pathology Differential Diagnosis: Cyst vs Other  Check Margins: No 1.1 cm firm subcutaneous nodule     Return in about 1 week (around 02/12/2023) for Suture Removal.  Anise Salvo, RMA, am acting as scribe for Willeen Niece, MD .   Documentation: I have reviewed the above documentation for accuracy and completeness, and I agree with the above.  Willeen Niece, MD

## 2023-02-06 ENCOUNTER — Telehealth: Payer: Self-pay

## 2023-02-06 NOTE — Telephone Encounter (Signed)
Patient doing fine after yesterdays surgery./sh 

## 2023-02-10 ENCOUNTER — Encounter: Payer: Managed Care, Other (non HMO) | Admitting: Dermatology

## 2023-02-12 ENCOUNTER — Ambulatory Visit: Payer: Managed Care, Other (non HMO) | Admitting: Dermatology

## 2023-02-12 DIAGNOSIS — L729 Follicular cyst of the skin and subcutaneous tissue, unspecified: Secondary | ICD-10-CM

## 2023-02-12 NOTE — Patient Instructions (Signed)
   After Suture Removal  If your medical team has placed Steri-Strips (white adhesive strips covering the surgical site to provide extra support): Keep the area dry until they fall off.  Do not peel them off. Just let them fall off on their own.  If the edges peel up, you can trim them with scissors.   If your team has not placed Steri-Strips: Wash the area daily with soap and water. Then coat the incision site with plain Vaseline and cover with a bandage. Do this daily for 5 days after the sutures are removed. After that, no additional wound care is generally needed.  However, if you would like to help fade the scar, you can apply a silicone scar cream, gel or sheet every night. The scar will remodel for one year after the procedure. If a skin cancer was removed, be sure to keep your appointment with your dermatologist for follow-up and let your dermatology team know if you have any new or changing spots between visits.    Please call our office at [phone] for any questions or concerns.Due to recent changes in healthcare laws, you may see results of your pathology and/or laboratory studies on MyChart before the doctors have had a chance to review them. We understand that in some cases there may be results that are confusing or concerning to you. Please understand that not all results are received at the same time and often the doctors may need to interpret multiple results in order to provide you with the best plan of care or course of treatment. Therefore, we ask that you please give us 2 business days to thoroughly review all your results before contacting the office for clarification. Should we see a critical lab result, you will be contacted sooner.   If You Need Anything After Your Visit  If you have any questions or concerns for your doctor, please call our main line at [phone] and press option 4 to reach your doctor's medical assistant. If no one answers, please leave a  voicemail as directed and we will return your call as soon as possible. Messages left after 4 pm will be answered the following business day.   You may also send us a message via MyChart. We typically respond to MyChart messages within 1-2 business days.  For prescription refills, please ask your pharmacy to contact our office. Our fax number is [phone].  If you have an urgent issue when the clinic is closed that cannot wait until the next business day, you can page your doctor at the number below.    Please note that while we do our best to be available for urgent issues outside of office hours, we are not available 24/7.   If you have an urgent issue and are unable to reach us, you may choose to seek medical care at your doctor's office, retail clinic, urgent care center, or emergency room.  If you have a medical emergency, please immediately call 911 or go to the emergency department.  Pager Numbers  - Dr. Kowalski: [phone]  - Dr. Moye: [phone]  - Dr. Stewart: [phone]  In the event of inclement weather, please call our main line at [phone] for an update on the status of any delays or closures.  Dermatology Medication Tips: Please keep the boxes that topical medications come in in order to help keep track of the instructions about where and how to use these. Pharmacies typically print the medication instructions only on the   boxes and not directly on the medication tubes.   If your medication is too expensive, please contact our office at [phone] option 4 or send us a message through MyChart.   We are unable to tell what your co-pay for medications will be in advance as this is different depending on your insurance coverage. However, we may be able to find a substitute medication at lower cost or fill out paperwork to get insurance to cover a needed medication.   If a prior authorization is required to get your medication covered by your insurance  company, please allow us 1-2 business days to complete this process.  Drug prices often vary depending on where the prescription is filled and some pharmacies may offer cheaper prices.  The website www.goodrx.com contains coupons for medications through different pharmacies. The prices here do not account for what the cost may be with help from insurance (it may be cheaper with your insurance), but the website can give you the price if you did not use any insurance.  - You can print the associated coupon and take it with your prescription to the pharmacy.  - You may also stop by our office during regular business hours and pick up a GoodRx coupon card.  - If you need your prescription sent electronically to a different pharmacy, notify our office through [facility] MyChart or by phone at [phone] option 4.     Si Usted Necesita Algo Despus de Su Visita  Tambin puede enviarnos un mensaje a travs de MyChart. Por lo general respondemos a los mensajes de MyChart en el transcurso de 1 a 2 das hbiles.  Para renovar recetas, por favor pida a su farmacia que se ponga en contacto con nuestra oficina. Nuestro nmero de fax es el [phone].  Si tiene un asunto urgente cuando la clnica est cerrada y que no puede esperar hasta el siguiente da hbil, puede llamar/localizar a su doctor(a) al nmero que aparece a continuacin.   Por favor, tenga en cuenta que aunque hacemos todo lo posible para estar disponibles para asuntos urgentes fuera del horario de oficina, no estamos disponibles las 24 horas del da, los 7 das de la semana.   Si tiene un problema urgente y no puede comunicarse con nosotros, puede optar por buscar atencin mdica  en el consultorio de su doctor(a), en una clnica privada, en un centro de atencin urgente o en una sala de emergencias.  Si tiene una emergencia mdica, por favor llame inmediatamente al 911 o vaya a la sala de emergencias.  Nmeros de bper  - Dr.  Kowalski: [phone]  - Dra. Moye: [phone]  - Dra. Stewart: [phone]  En caso de inclemencias del tiempo, por favor llame a nuestra lnea principal al [phone] para una actualizacin sobre el estado de cualquier retraso o cierre.  Consejos para la medicacin en dermatologa: Por favor, guarde las cajas en las que vienen los medicamentos de uso tpico para ayudarle a seguir las instrucciones sobre dnde y cmo usarlos. Las farmacias generalmente imprimen las instrucciones del medicamento slo en las cajas y no directamente en los tubos del medicamento.   Si su medicamento es muy caro, por favor, pngase en contacto con nuestra oficina llamando al [phone] y presione la opcin 4 o envenos un mensaje a travs de MyChart.   No podemos decirle cul ser su copago por los medicamentos por adelantado ya que esto es diferente dependiendo de la cobertura de su seguro. Sin embargo, es posible que podamos   encontrar un medicamento sustituto a menor costo o llenar un formulario para que el seguro cubra el medicamento que se considera necesario.   Si se requiere una autorizacin previa para que su compaa de seguros cubra su medicamento, por favor permtanos de 1 a 2 das hbiles para completar este proceso.  Los precios de los medicamentos varan con frecuencia dependiendo del lugar de dnde se surte la receta y alguna farmacias pueden ofrecer precios ms baratos.  El sitio web www.goodrx.com tiene cupones para medicamentos de diferentes farmacias. Los precios aqu no tienen en cuenta lo que podra costar con la ayuda del seguro (puede ser ms barato con su seguro), pero el sitio web puede darle el precio si no utiliz ningn seguro.  - Puede imprimir el cupn correspondiente y llevarlo con su receta a la farmacia.  - Tambin puede pasar por nuestra oficina durante el horario de atencin regular y recoger una tarjeta de cupones de GoodRx.  - Si necesita que su receta se enve  electrnicamente a una farmacia diferente, informe a nuestra oficina a travs de MyChart de [facility] [facility] o por telfono llamando al [phone] y presione la opcin 4.  

## 2023-02-12 NOTE — Progress Notes (Signed)
   Follow-Up Visit   Subjective  Erica Noble is a 62 y.o. female who presents for the following: Suture removal.   Pathology showed epidermoid cyst, inflamed.  The following portions of the chart were reviewed this encounter and updated as appropriate: medications, allergies, medical history  Review of Systems:  No other skin or systemic complaints except as noted in HPI or Assessment and Plan.  Objective  Well appearing patient in no apparent distress; mood and affect are within normal limits.  Areas Examined: Central upper abdomen Relevant physical exam findings are noted in the Assessment and Plan.    Assessment & Plan    Encounter for Removal of Sutures - Incision site is clean, dry and intact. - Wound cleansed, sutures removed, wound cleansed and steri strips applied.  - Discussed pathology results showing epidermoid cyst, inflamed - Patient advised to keep steri-strips dry until they fall off. - Scars remodel for a full year. - Once steri-strips fall off, patient can apply over-the-counter silicone scar cream once to twice a day to help with scar remodeling if desired. - Patient advised to call with any concerns or if they notice any new or changing lesions.  Return as scheduled, for TBSE.  ICherlyn Labella, CMA, am acting as scribe for Willeen Niece, MD .   Documentation: I have reviewed the above documentation for accuracy and completeness, and I agree with the above.  Willeen Niece, MD

## 2023-02-13 ENCOUNTER — Encounter: Payer: Managed Care, Other (non HMO) | Admitting: Dermatology

## 2023-04-07 ENCOUNTER — Other Ambulatory Visit: Payer: Self-pay | Admitting: Obstetrics and Gynecology

## 2023-04-07 DIAGNOSIS — Z1231 Encounter for screening mammogram for malignant neoplasm of breast: Secondary | ICD-10-CM

## 2023-04-18 ENCOUNTER — Other Ambulatory Visit: Payer: Self-pay | Admitting: Family Medicine

## 2023-04-21 ENCOUNTER — Telehealth: Payer: Self-pay | Admitting: Family Medicine

## 2023-04-21 ENCOUNTER — Other Ambulatory Visit: Payer: Self-pay

## 2023-04-21 MED ORDER — SYNTHROID 125 MCG PO TABS: 125.0000 ug | ORAL_TABLET | Freq: Every day | ORAL | 3 refills | Status: DC

## 2023-04-21 NOTE — Telephone Encounter (Signed)
refilled

## 2023-04-21 NOTE — Telephone Encounter (Signed)
Requested Prescriptions  Refused Prescriptions Disp Refills   levothyroxine (SYNTHROID) 125 MCG tablet [Pharmacy Med Name: LEVOTHYROXINE 0.125MG  ( ) TAB] 90 tablet 3    Sig: TAKE 1 TABLET(125 MCG) BY MOUTH DAILY BEFORE BREAKFAST     Endocrinology:  Hypothyroid Agents Passed - 04/18/2023  1:45 PM      Passed - TSH in normal range and within 360 days    TSH  Date Value Ref Range Status  12/09/2022 3.870 0.450 - 4.500 uIU/mL Final         Passed - Valid encounter within last 12 months    Recent Outpatient Visits           4 months ago Constipation due to neurogenic bowel   [place] [facility] Simmons-Robinson, Tawanna Cooler, MD   1 year ago Frequent urination   [place] [facility] [place], Kimberly, MD   1 year ago Frequent urination   [place] [facility] [place], Tawanna Cooler, MD   1 year ago Annual physical exam   [facility] Bosie Clos, MD   2 years ago Adult hypothyroidism   [facility] Bosie Clos, MD       Future Appointments             In 1 month Simmons-Robinson, Tawanna Cooler, MD [facility]

## 2023-04-21 NOTE — Telephone Encounter (Signed)
[facility] pharmacy is requesting prescription refill SYNTHROID 125 MCG tablet  Please advise

## 2023-05-06 ENCOUNTER — Telehealth: Payer: Self-pay

## 2023-05-06 NOTE — Telephone Encounter (Signed)
Faxed pt's Disability Claim to [facility] at [phone] On 05/06/2023.

## 2023-05-22 ENCOUNTER — Ambulatory Visit
Admission: RE | Admit: 2023-05-22 | Discharge: 2023-05-22 | Disposition: A | Discharge status: Home / Self Care | Payer: Managed Care, Other (non HMO) | Source: Ambulatory Visit | Attending: Obstetrics and Gynecology | Admitting: Obstetrics and Gynecology

## 2023-05-22 DIAGNOSIS — Z1231 Encounter for screening mammogram for malignant neoplasm of breast: Secondary | ICD-10-CM | POA: Insufficient documentation

## 2023-06-13 ENCOUNTER — Encounter: Payer: Self-pay | Admitting: Family Medicine

## 2023-06-13 ENCOUNTER — Ambulatory Visit (INDEPENDENT_AMBULATORY_CARE_PROVIDER_SITE_OTHER): Payer: Managed Care, Other (non HMO) | Admitting: Family Medicine

## 2023-06-13 VITALS — BP 124/62 | HR 65 | Ht 65.0 in | Wt 229.3 lb

## 2023-06-13 DIAGNOSIS — Z23 Encounter for immunization: Secondary | ICD-10-CM

## 2023-06-13 DIAGNOSIS — Z131 Encounter for screening for diabetes mellitus: Secondary | ICD-10-CM

## 2023-06-13 DIAGNOSIS — E039 Hypothyroidism, unspecified: Secondary | ICD-10-CM

## 2023-06-13 DIAGNOSIS — G35 Multiple sclerosis: Secondary | ICD-10-CM

## 2023-06-13 DIAGNOSIS — E559 Vitamin D deficiency, unspecified: Secondary | ICD-10-CM

## 2023-06-13 DIAGNOSIS — Z0000 Encounter for general adult medical examination without abnormal findings: Secondary | ICD-10-CM | POA: Insufficient documentation

## 2023-06-13 DIAGNOSIS — Z0001 Encounter for general adult medical examination with abnormal findings: Secondary | ICD-10-CM

## 2023-06-13 DIAGNOSIS — E538 Deficiency of other specified B group vitamins: Secondary | ICD-10-CM | POA: Insufficient documentation

## 2023-06-13 DIAGNOSIS — E782 Mixed hyperlipidemia: Secondary | ICD-10-CM

## 2023-06-13 NOTE — Progress Notes (Signed)
Complete physical exam   Patient: Erica Noble   DOB: 03-Feb-1961   62 y.o. Female  MRN: [id number] Visit Date: 06/13/2023  Today's healthcare provider: Ronnald Ramp, MD   Chief Complaint  Patient presents with   Annual Exam    Diet- general Exercise - none Feeling - well Sleeping - well  Concerns - Patient has been having concerns with regular bowel movements for the last year and husband has concerns of more consistent headaches. Also would like to know if thyroid is going to be rechecked today   Immunizations    Has a question before receiving influenza    Subjective    Erica Noble is a 62 y.o. female who presents today for a complete physical exam.   She reports consuming a general diet.   The patient does not participate in regular exercise at present.   She generally feels well.   She reports sleeping well.    She does not have additional problems to discuss today.   Discussed the use of AI scribe software for clinical note transcription with the patient, who gave verbal consent to proceed.  History of Present Illness   The patient, a 62 year old individual with a history of multiple sclerosis (MS), presents for an annual physical with questions about the flu vaccine. She reports no new physical symptoms. However, the patient's spouse notes an increase in the frequency of the patient's headaches, raising concerns about whether this should be addressed. The patient also inquires about rechecking her thyroid levels, with the last tests conducted in May showing normal results.  The patient has been on Synthroid, with one or two refills remaining, and expresses a desire to review her current levels before deciding on further refills. She also mentions a need for Vitamin B12. The patient's spouse has been encouraging her to take Vitamin D3 with Vitamin K2 and B12, but adherence has been inconsistent in recent weeks.  The patient has a history of  adverse reactions to vaccines when administered close together, citing an instance of spleen enlargement and prolonged illness after receiving a domestic and international flu shot within a three-day period. This experience has led to hesitancy about receiving the flu vaccine concurrently with other vaccines. Despite this, the patient expresses a willingness to receive the flu vaccine, provided it is not administered simultaneously with other vaccines.  The patient's spouse reports observing a progressive struggle with walking, which she attributes to the patient's MS. This has led to considerations of modifying their home to accommodate the patient's mobility challenges. The patient's spouse also mentions a theory linking Malachi Carl to MS, which she read about but has not pursued further due to the uncertainty surrounding MS research.  The patient's overall health appears stable, with no new physical symptoms reported. However, the increase in headache frequency and the patient's mobility challenges due to MS are areas of concern. The patient's medication regimen, including Synthroid and potential Vitamin B12 supplementation, will be reviewed based on lab results. The patient is open to receiving the flu vaccine, but with caution due to past adverse reactions.         Past Medical History:  Diagnosis Date   Allergy 05/26/2009   Allergic rhinitis   Basal cell carcinoma 01/17/2010   left upper lip/Moh's Dr. Adriana Simas   Cancer [facility])    skin ca   Depression    H/O thyroidectomy 2006   Heart murmur    History of dysplastic nevus 12/11/2007  mid back/severe   Hyperlipidemia    Multiple sclerosis (HCC)    2008 diagnosis   Thyroid disease    Past Surgical History:  Procedure Laterality Date   BREAST BIOPSY Right 2014   bx/clip- neg   CHOLECYSTECTOMY  2000   COLONOSCOPY WITH PROPOFOL N/A 11/14/2022   Procedure: COLONOSCOPY WITH PROPOFOL;  Surgeon: Toney Reil, MD;  Location: [facility];  Service: Endoscopy;  Laterality: N/A;   history of thyroidectomy  2008   POLYPECTOMY  11/14/2022   Procedure: POLYPECTOMY;  Surgeon: Toney Reil, MD;  Location: [facility];  Service: Endoscopy;;   Social History   Socioeconomic History   Marital status: Married    Spouse name: Not on file   Number of children: Not on file   Years of education: Not on file   Highest education level: Bachelor's degree (e.g., BA, AB, BS)  Occupational History   Not on file  Tobacco Use   Smoking status: Former    Current packs/day: 0.00    Types: Cigarettes    Quit date: 07/29/1986    Years since quitting: 36.8   Smokeless tobacco: Never  Vaping Use   Vaping status: Never Used  Substance and Sexual Activity   Alcohol use: No    Comment: occ   Drug use: No   Sexual activity: Not on file  Other Topics Concern   Not on file  Social History Narrative   Caffeine tea 1.5 glasses daily. Education:  BA. Work previous Advertising account planner.   Disability with [facility]   Right handed   Lives at home with husband   Social Determinants of Health   Financial Resource Strain: Low Risk  (12/05/2022)   Overall Financial Resource Strain (CARDIA)    Difficulty of Paying Living Expenses: Not very hard  Food Insecurity: Food Insecurity Present (12/05/2022)   Hunger Vital Sign    Worried About Running Out of Food in the Last Year: Sometimes true    Ran Out of Food in the Last Year: Sometimes true  Transportation Needs: No Transportation Needs (12/05/2022)   PRAPARE - Administrator, Civil Service (Medical): No    Lack of Transportation (Non-Medical): No  Physical Activity: Unknown (12/05/2022)   Exercise Vital Sign    Days of Exercise per Week: Patient declined    Minutes of Exercise per Session: Not on file  Stress: No Stress Concern Present (12/05/2022)   [employer] of Occupational Health - Occupational Stress Questionnaire    Feeling of Stress : Only a little   Social Connections: Unknown (12/05/2022)   Social Connection and Isolation Panel [NHANES]    Frequency of Communication with Friends and Family: Patient declined    Frequency of Social Gatherings with Friends and Family: Patient declined    Attends Religious Services: Patient declined    Database administrator or Organizations: Patient declined    Attends Engineer, structural: Not on file    Marital Status: Married  Catering manager Violence: Not on file   Family Status  Relation Name Status   Neg Hx  (Not Specified)  No partnership data on file   Family History  Adopted: Yes  Problem Relation Age of Onset   Breast cancer Neg Hx    Allergies  Allergen Reactions   Epinephrine Shortness Of Breath and Swelling    During dental procedure      Medications: Outpatient Medications Prior to Visit  Medication Sig   aspirin EC  81 MG tablet Take 81 mg by mouth daily. (Patient not taking: Reported on 12/20/2022)   cholecalciferol (VITAMIN D3) 25 MCG (1000 UNIT) tablet Take 1,000 Units by mouth daily.   Cyanocobalamin (VITAMIN B 12 PO) Take by mouth.   doxycycline (MONODOX) 100 MG capsule Take 1 capsule (100 mg total) by mouth 2 (two) times daily. Take with food as directed   polyethylene glycol powder (GLYCOLAX/MIRALAX) 17 GM/SCOOP powder Take 17 g by mouth 2 (two) times daily as needed for mild constipation or moderate constipation.   senna (SENOKOT) 8.6 MG tablet Take 1 tablet (8.6 mg total) by mouth daily. (Patient not taking: Reported on 12/20/2022)   SYNTHROID 125 MCG tablet Take 1 tablet (125 mcg total) by mouth daily before breakfast.   No facility-administered medications prior to visit.    Review of Systems  Last CBC Lab Results  Component Value Date   WBC 6.1 08/16/2022   HGB 13.7 08/16/2022   HCT 40.1 08/16/2022   MCV 95 08/16/2022   MCH 32.5 08/16/2022   RDW 12.3 08/16/2022   PLT 215 08/16/2022   Last metabolic panel Lab Results  Component Value Date    GLUCOSE 101 (H) 12/09/2022   NA 142 12/09/2022   K 4.2 12/09/2022   CL 102 12/09/2022   CO2 24 12/09/2022   BUN 14 12/09/2022   CREATININE 0.85 12/09/2022   EGFR 78 12/09/2022   CALCIUM 9.3 12/09/2022   PROT 6.6 12/09/2022   ALBUMIN 4.2 12/09/2022   LABGLOB 2.4 12/09/2022   AGRATIO 1.8 12/09/2022   BILITOT 0.4 12/09/2022   ALKPHOS 83 12/09/2022   AST 18 12/09/2022   ALT 17 12/09/2022   Last lipids Lab Results  Component Value Date   CHOL 178 01/02/2022   HDL 56 01/02/2022   LDLCALC 110 (H) 01/02/2022   TRIG 63 01/02/2022   CHOLHDL 3.2 01/02/2022   Last hemoglobin A1c Lab Results  Component Value Date   HGBA1C 5.3 01/02/2022   Last thyroid functions Lab Results  Component Value Date   TSH 3.870 12/09/2022   Last vitamin D Lab Results  Component Value Date   VD25OH 38.8 04/28/2019       Objective    BP 124/62 (BP Location: Right Arm, Patient Position: Sitting, Cuff Size: Large)   Pulse 65   Ht 5\' 5"  (1.651 m)   Wt 229 lb 4.8 oz (104 kg)   SpO2 100%   BMI 38.16 kg/m  BP Readings from Last 3 Encounters:  06/13/23 124/62  12/20/22 128/76  12/09/22 107/62   Wt Readings from Last 3 Encounters:  06/13/23 229 lb 4.8 oz (104 kg)  12/20/22 225 lb (102.1 kg)  12/09/22 222 lb 1.6 oz (100.7 kg)        Physical Exam Vitals reviewed.  Constitutional:      General: She is not in acute distress.    Appearance: Normal appearance. She is not ill-appearing, toxic-appearing or diaphoretic.  HENT:     Head: Normocephalic and atraumatic.     Right Ear: Tympanic membrane and external ear normal. There is no impacted cerumen.     Left Ear: Tympanic membrane and external ear normal. There is no impacted cerumen.     Nose: Nose normal.     Mouth/Throat:     Pharynx: Oropharynx is clear.  Eyes:     General: No scleral icterus.    Extraocular Movements: Extraocular movements intact.     Conjunctiva/sclera: Conjunctivae normal.     Pupils: Pupils  are equal, round,  and reactive to light.  Cardiovascular:     Rate and Rhythm: Normal rate and regular rhythm.     Pulses: Normal pulses.     Heart sounds: Normal heart sounds. No murmur heard.    No friction rub. No gallop.  Pulmonary:     Effort: Pulmonary effort is normal. No respiratory distress.     Breath sounds: Normal breath sounds. No wheezing, rhonchi or rales.  Abdominal:     General: Bowel sounds are normal. There is no distension.     Palpations: Abdomen is soft. There is no mass.     Tenderness: There is no abdominal tenderness. There is no guarding.  Musculoskeletal:        General: No deformity.     Cervical back: Normal range of motion and neck supple. No rigidity.     Right lower leg: No edema.     Left lower leg: No edema.  Lymphadenopathy:     Cervical: No cervical adenopathy.  Skin:    General: Skin is warm.     Capillary Refill: Capillary refill takes less than 2 seconds.     Findings: No erythema or rash.  Neurological:     General: No focal deficit present.     Mental Status: She is alert and oriented to person, place, and time.     Cranial Nerves: Cranial nerves 2-12 are intact. No dysarthria.     Motor: Weakness present.     Gait: Gait abnormal.     Comments: 4/5 strength on right, patient is in transport chair unable to ambulate independently in clinic  Psychiatric:        Mood and Affect: Mood normal.        Behavior: Behavior normal.       Last depression screening scores    06/13/2023    3:42 PM 12/09/2022    4:04 PM 04/05/2022    4:17 PM  PHQ 2/9 Scores  PHQ - 2 Score 2 2 2   PHQ- 9 Score 12 7 9     Last fall risk screening    06/13/2023    3:42 PM  Fall Risk   Falls in the past year? 1  Number falls in past yr: 1  Injury with Fall? 0  Risk for fall due to : History of fall(s)  Follow up Falls evaluation completed    Last Audit-C alcohol use screening    12/09/2022    4:06 PM  Alcohol Use Disorder Test (AUDIT)  1. How often do you have a drink  containing alcohol? 1  2. How many drinks containing alcohol do you have on a typical day when you are drinking? 0  3. How often do you have six or more drinks on one occasion? 0  AUDIT-C Score 1   A score of 3 or more in women, and 4 or more in men indicates increased risk for alcohol abuse, EXCEPT if all of the points are from question 1   No results found for any visits on 06/13/23.  Assessment & Plan    Routine Health Maintenance and Physical Exam  Immunization History  Administered Date(s) Administered   Influenza, Seasonal, Injecte, Preservative Fre 06/13/2023   Influenza,inj,Quad PF,6+ Mos 04/28/2019, 05/03/2020   Influenza-Unspecified 04/18/2019, 04/28/2019   PFIZER Comirnaty(Gray Top)Covid-19 Tri-Sucrose Vaccine 03/23/2020   PFIZER(Purple Top)SARS-COV-2 Vaccination 10/24/2019, 11/17/2019   Tdap 12/30/2012    Health Maintenance  Topic Date Due   Zoster Vaccines- Shingrix (1 of 2) Never  done   Cervical Cancer Screening (HPV/Pap Cotest)  08/20/2022   DTaP/Tdap/Td (2 - Td or Tdap) 12/31/2022   COVID-19 Vaccine (4 - 2023-24 season) 03/30/2023   MAMMOGRAM  05/21/2025   Colonoscopy  11/14/2027   INFLUENZA VACCINE  Completed   Hepatitis C Screening  Completed   HIV Screening  Completed   HPV VACCINES  Aged Out    Problem List Items Addressed This Visit       Endocrine   Adult hypothyroidism   Relevant Orders   TSH+T4F+T3Free   Acquired hypothyroidism     Nervous and Auditory   Multiple sclerosis (HCC)   Relevant Orders   CBC     Other   Vitamin B12 deficiency   Relevant Orders   Vitamin B12   HLD (hyperlipidemia)   Relevant Orders   CMP14+EGFR   Lipid panel   Avitaminosis D   Relevant Orders   Vitamin D (25 hydroxy)   Other Visit Diagnoses     Annual physical exam    -  Primary   Screening for diabetes mellitus       Relevant Orders   Hemoglobin A1c   Need for immunization against influenza       Relevant Orders   Flu vaccine trivalent PF, 6mos  and older(Flulaval,Afluria,Fluarix,Fluzone) (Completed)       Annual Physical Examination Routine annual physical examination for a 62 year old female. No acute concerns reported. Discussed the importance of regular screenings and vaccinations. - Order hemoglobin A1c for diabetes screening - Order CBC for anemia screening - Order CMP, lipoprotein, and thyroid studies  Multiple Sclerosis (MS) Progressive worsening of MS symptoms. Discussed flu vaccination impact on MS. Patient has previously tolerated flu vaccines. Advised spacing out flu and COVID vaccines to minimize flu-like symptoms. Discussed potential Epstein-Barr virus link to MS. - Administer flu vaccine if patient consents - Advise spacing out flu and COVID vaccines to minimize flu-like symptoms  Headaches Reported more frequent headaches. Will monitor and consider further evaluation if symptoms persist or worsen. - Monitor headache frequency and severity  Hypothyroidism Inquired about rechecking thyroid levels. Last tests in May were normal. Currently on Synthroid 125 mcg. Will evaluate thyroid levels before adjusting dosage. - Order thyroid function tests - Evaluate thyroid levels before deciding on Synthroid dosage adjustment  Vitamin D and B12 Supplementation Discussed benefits and concerns of Vitamin D3 with K2 and B12 supplementation. Will consider based on further research and patient preference. - Order B12 level - Consider Vitamin D3 with K2 supplementation based on further research and patient preference  General Health Maintenance Discussed flu vaccination and potential side effects of multiple vaccines. Patient considering getting flu shot at a pharmacy if not administered today. - Administer flu vaccine if patient consents - Advise patient to get flu vaccine at a pharmacy if not administered today  Follow-up - Schedule follow-up appointment based on lab results and thyroid function tests.      Return in  about 4 months (around 10/11/2023) for CHRONIC F/U.     Ronnald Ramp, MD  [facility] [phone] (phone) [phone] (fax)  [facility]

## 2023-07-04 LAB — CBC
Hematocrit: 42.3 % (ref 34.0–46.6)
Hemoglobin: 14 g/dL (ref 11.1–15.9)
MCH: 32.4 pg (ref 26.6–33.0)
MCHC: 33.1 g/dL (ref 31.5–35.7)
MCV: 98 fL — ABNORMAL HIGH (ref 79–97)
Platelets: 224 10*3/uL (ref 150–450)
RBC: 4.32 x10E6/uL (ref 3.77–5.28)
RDW: 11.7 % (ref 11.7–15.4)
WBC: 6.8 10*3/uL (ref 3.4–10.8)

## 2023-07-04 LAB — CMP14+EGFR
ALT: 16 [IU]/L (ref 0–32)
AST: 16 [IU]/L (ref 0–40)
Albumin: 4.3 g/dL (ref 3.9–4.9)
Alkaline Phosphatase: 72 [IU]/L (ref 44–121)
BUN/Creatinine Ratio: 19 (ref 12–28)
BUN: 15 mg/dL (ref 8–27)
Bilirubin Total: 0.4 mg/dL (ref 0.0–1.2)
CO2: 24 mmol/L (ref 20–29)
Calcium: 9.2 mg/dL (ref 8.7–10.3)
Chloride: 106 mmol/L (ref 96–106)
Creatinine, Ser: 0.81 mg/dL (ref 0.57–1.00)
Globulin, Total: 2.4 g/dL (ref 1.5–4.5)
Glucose: 117 mg/dL — ABNORMAL HIGH (ref 70–99)
Potassium: 4.2 mmol/L (ref 3.5–5.2)
Sodium: 143 mmol/L (ref 134–144)
Total Protein: 6.7 g/dL (ref 6.0–8.5)
eGFR: 82 mL/min/{1.73_m2} (ref >59)

## 2023-07-04 LAB — TSH+T4F+T3FREE
Free T4: 1.54 ng/dL (ref 0.82–1.77)
T3, Free: 3 pg/mL (ref 2.0–4.4)
TSH: 1.18 u[IU]/mL (ref 0.450–4.500)

## 2023-07-04 LAB — LIPID PANEL
Chol/HDL Ratio: 3.1 {ratio} (ref 0.0–4.4)
Cholesterol, Total: 183 mg/dL (ref 100–199)
HDL: 60 mg/dL (ref >39)
LDL Chol Calc (NIH): 110 mg/dL — ABNORMAL HIGH (ref 0–99)
Triglycerides: 68 mg/dL (ref 0–149)
VLDL Cholesterol Cal: 13 mg/dL (ref 5–40)

## 2023-07-04 LAB — HEMOGLOBIN A1C
Est. average glucose Bld gHb Est-mCnc: 114 mg/dL
Hgb A1c MFr Bld: 5.6 % (ref 4.8–5.6)

## 2023-07-04 LAB — VITAMIN B12: Vitamin B-12: 532 pg/mL (ref 232–1245)

## 2023-07-04 LAB — VITAMIN D 25 HYDROXY (VIT D DEFICIENCY, FRACTURES): Vit D, 25-Hydroxy: 49.5 ng/mL (ref 30.0–100.0)

## 2023-08-07 ENCOUNTER — Ambulatory Visit (INDEPENDENT_AMBULATORY_CARE_PROVIDER_SITE_OTHER): Payer: Managed Care, Other (non HMO) | Admitting: Dermatology

## 2023-08-07 ENCOUNTER — Encounter: Payer: Self-pay | Admitting: Dermatology

## 2023-08-07 DIAGNOSIS — W908XXA Exposure to other nonionizing radiation, initial encounter: Secondary | ICD-10-CM | POA: Diagnosis not present

## 2023-08-07 DIAGNOSIS — L578 Other skin changes due to chronic exposure to nonionizing radiation: Secondary | ICD-10-CM | POA: Diagnosis not present

## 2023-08-07 DIAGNOSIS — D1801 Hemangioma of skin and subcutaneous tissue: Secondary | ICD-10-CM

## 2023-08-07 DIAGNOSIS — Z1283 Encounter for screening for malignant neoplasm of skin: Secondary | ICD-10-CM

## 2023-08-07 DIAGNOSIS — L814 Other melanin hyperpigmentation: Secondary | ICD-10-CM | POA: Diagnosis not present

## 2023-08-07 DIAGNOSIS — D229 Melanocytic nevi, unspecified: Secondary | ICD-10-CM

## 2023-08-07 DIAGNOSIS — Z86018 Personal history of other benign neoplasm: Secondary | ICD-10-CM

## 2023-08-07 DIAGNOSIS — Z85828 Personal history of other malignant neoplasm of skin: Secondary | ICD-10-CM

## 2023-08-07 DIAGNOSIS — L821 Other seborrheic keratosis: Secondary | ICD-10-CM

## 2023-08-07 DIAGNOSIS — L905 Scar conditions and fibrosis of skin: Secondary | ICD-10-CM

## 2023-08-07 NOTE — Patient Instructions (Signed)

## 2023-08-07 NOTE — Progress Notes (Deleted)
   Follow-Up Visit   Subjective  Erica Noble is a 63 y.o. female who presents for the following: Skin Cancer Screening and Full Body Skin Exam. Hx of BCC. Hx of dysplastic nevus, severe.  The patient presents for Total-Body Skin Exam (TBSE) for skin cancer screening and mole check. The patient has spots, moles and lesions to be evaluated, some may be new or changing and the patient may have concern these could be cancer.    The following portions of the chart were reviewed this encounter and updated as appropriate: medications, allergies, medical history  Review of Systems:  No other skin or systemic complaints except as noted in HPI or Assessment and Plan.  Objective  Well appearing patient in no apparent distress; mood and affect are within normal limits.  A full examination was performed including scalp, head, eyes, ears, nose, lips, neck, chest, axillae, abdomen, back, buttocks, bilateral upper extremities, bilateral lower extremities, hands, feet, fingers, toes, fingernails, and toenails. All findings within normal limits unless otherwise noted below.   Relevant physical exam findings are noted in the Assessment and Plan.    Assessment & Plan   SKIN CANCER SCREENING PERFORMED TODAY.  ACTINIC DAMAGE - Chronic condition, secondary to cumulative UV/sun exposure - diffuse scaly erythematous macules with underlying dyspigmentation - Recommend daily broad spectrum sunscreen SPF 30+ to sun-exposed areas, reapply every 2 hours as needed.  - Staying in the shade or wearing long sleeves, sun glasses (UVA+UVB protection) and wide brim hats (4-inch brim around the entire circumference of the hat) are also recommended for sun protection.  - Call for new or changing lesions.  LENTIGINES, SEBORRHEIC KERATOSES, HEMANGIOMAS - Benign normal skin lesions - Benign-appearing - Call for any changes  MELANOCYTIC NEVI - Tan-brown and/or pink-flesh-colored symmetric macules and papules -  Benign appearing on exam today - Observation - Call clinic for new or changing moles - Recommend daily use of broad spectrum spf 30+ sunscreen to sun-exposed areas.        Return in about 1 year (around 08/06/2024) for TBSE, HxBCC, HxDN.  I, Mehtab Dolberry, CMA, am acting as scribe for Boneta Sharps, MD.   Documentation: I have reviewed the above documentation for accuracy and completeness, and I agree with the above.  Boneta Sharps, MD

## 2023-08-07 NOTE — Progress Notes (Deleted)
   Follow-Up Visit   Subjective  Erica Noble is a 63 y.o. female who presents for the following: Spots  The patient has spots, moles and lesions to be evaluated, some may be new or changing and the patient may have concern these could be cancer.  Husband, [name], is with patient and contributes to history.    The following portions of the chart were reviewed this encounter and updated as appropriate: medications, allergies, medical history  Review of Systems:  No other skin or systemic complaints except as noted in HPI or Assessment and Plan.  Objective  Well appearing patient in no apparent distress; mood and affect are within normal limits.  A focused examination was performed of the following areas: *** Relevant physical exam findings are noted in the Assessment and Plan.    Assessment & Plan      Return in about 1 year (around 08/06/2024) for TBSE, HxBCC, HxDN.  I, Delante Karapetyan, CMA, am acting as scribe for Boneta Sharps, MD.   Documentation: I have reviewed the above documentation for accuracy and completeness, and I agree with the above.  Boneta Sharps, MD

## 2023-08-07 NOTE — Progress Notes (Signed)
   Follow-Up Visit   Subjective  Erica Noble is a 63 y.o. female who presents for the following: Skin Cancer Screening and Upper Body Skin Exam. Hx of BCC. Hx of dysplastic nevus, severe. Patient defers TBSE today.  The patient presents for Upper Body Skin Exam (UBSE) for skin cancer screening and mole check. The patient has spots, moles and lesions to be evaluated, some may be new or changing and the patient may have concern these could be cancer.  Erica, Noble, is with patient and contributes to history.   The following portions of the chart were reviewed this encounter and updated as appropriate: medications, allergies, medical history  Review of Systems:  No other skin or systemic complaints except as noted in HPI or Assessment and Plan.  Objective  Well appearing patient in no apparent distress; mood and affect are within normal limits.  All skin waist up examined. Relevant physical exam findings are noted in the Assessment and Plan.    Assessment & Plan   HISTORY OF BASAL CELL CARCINOMA OF THE SKIN. Left upper lip. Mohs, Dr. Bluford. 01/17/2010. - No evidence of recurrence today - Recommend regular full body skin exams - Recommend daily broad spectrum sunscreen SPF 30+ to sun-exposed areas, reapply every 2 hours as needed.  - Call if any new or changing lesions are noted between office visits   HISTORY OF DYSPLASTIC NEVUS. Mid back, severe. 12/11/2007. No evidence of recurrence today Recommend regular full body skin exams Recommend daily broad spectrum sunscreen SPF 30+ to sun-exposed areas, reapply every 2 hours as needed.  Call if any new or changing lesions are noted between office visits    Skin cancer screening performed today.  Actinic Damage - Chronic condition, secondary to cumulative UV/sun exposure - diffuse scaly erythematous macules with underlying dyspigmentation - Recommend daily broad spectrum sunscreen SPF 30+ to sun-exposed areas, reapply every 2 hours  as needed.  - Staying in the shade or wearing long sleeves, sun glasses (UVA+UVB protection) and wide brim hats (4-inch brim around the entire circumference of the hat) are also recommended for sun protection.  - Call for new or changing lesions.  Lentigines, Seborrheic Keratoses, Hemangiomas - Benign normal skin lesions - Benign-appearing - Call for any changes  Melanocytic Nevi - Tan-brown and/or pink-flesh-colored symmetric macules and papules - Benign appearing on exam today - Observation - Call clinic for new or changing moles - Recommend daily use of broad spectrum spf 30+ sunscreen to sun-exposed areas.   SCAR, secondary to cyst excision Exam: Dyspigmented smooth macule or patch at central upper abdomen. Benign-appearing.  Observation.  Call clinic for new or changing lesions. Recommend daily broad spectrum sunscreen SPF 30+, reapply every 2 hours as needed. Treatment: Recommend Serica moisturizing scar formula cream every night or [facility] brand or Mederma silicone scar sheet every night for the first year after a scar appears to help with scar remodeling if desired. Scars remodel on their own for a full year and will gradually improve in appearance over time.    Return in about 1 year (around 08/06/2024) for TBSE, HxBCC, HxDN.  I, Erica Noble, CMA, am acting as scribe for Boneta Sharps, MD.   Documentation: I have reviewed the above documentation for accuracy and completeness, and I agree with the above.  Boneta Sharps, MD

## 2023-08-14 ENCOUNTER — Telehealth: Payer: Self-pay | Admitting: Family Medicine

## 2023-08-14 NOTE — Telephone Encounter (Signed)
Raynelle Fanning the Care Coordinator or [facility] called to have her info added to patient file for any needs she may have being she has not been able to contact patient. Best contact#[phone] hours are 8-6:30 EST

## 2023-08-14 NOTE — Telephone Encounter (Signed)
Raynelle Fanning the Care Coordinator or [facility] called to have her info added to patient file for any needs she may have being she has not been able to contact patient. Best contact#[phone] hours are 8-6:30 EST   Please add to patient's information

## 2023-08-25 ENCOUNTER — Telehealth: Payer: Self-pay

## 2023-08-25 NOTE — Telephone Encounter (Signed)
Copied from CRM [id number]. Topic: General - Other >> Aug 25, 2023  9:27 AM Macon Large wrote: Reason for CRM: Raynelle Fanning a case manager with [facility] reports that she will be closing out the case. Raynelle Fanning stated that she left a voicemail for the patient with her contact information Cb# [phone] Ext. [phone] 8 am to 6:30 pm EST

## 2023-08-28 ENCOUNTER — Telehealth: Payer: Self-pay | Admitting: *Deleted

## 2023-08-28 NOTE — Telephone Encounter (Signed)
Per Kim/intrafusion pt came today for Ocrevus infusion. She expressed dissatisfaction with treatment/provider. She left office with husband without getting treatment.  I called and LVM for pt to call office so we can further discuss. I was going to try and get her scheduled to see Dr. Epimenio Foot to talk about treatment options/next steps.

## 2023-09-09 ENCOUNTER — Ambulatory Visit: Payer: Managed Care, Other (non HMO) | Admitting: Neurology

## 2023-09-09 ENCOUNTER — Telehealth: Payer: Self-pay | Admitting: Neurology

## 2023-09-09 NOTE — Telephone Encounter (Signed)
Pt called back and cx appt today. Rescheduled for 09/18/23 at 2pm with Dr. Epimenio Foot. She spoke w/ Pattricia Boss in phone room.

## 2023-09-09 NOTE — Telephone Encounter (Signed)
Called and spoke w/ pt. Offered today at 4pm w/ Dr. Epimenio Foot. She accepted. I scheduled.

## 2023-09-09 NOTE — Telephone Encounter (Signed)
Pt cancelling appointment due to  husband had to work and do not feel  comfortable driving to [facility]

## 2023-09-18 ENCOUNTER — Encounter: Payer: Self-pay | Admitting: Neurology

## 2023-09-18 ENCOUNTER — Ambulatory Visit: Payer: Managed Care, Other (non HMO) | Admitting: Neurology

## 2023-09-18 VITALS — BP 144/94 | HR 64 | Ht 65.0 in | Wt 233.0 lb

## 2023-09-18 DIAGNOSIS — G35 Multiple sclerosis: Secondary | ICD-10-CM

## 2023-09-18 DIAGNOSIS — Z114 Encounter for screening for human immunodeficiency virus [HIV]: Secondary | ICD-10-CM

## 2023-09-18 DIAGNOSIS — Z79899 Other long term (current) drug therapy: Secondary | ICD-10-CM

## 2023-09-18 DIAGNOSIS — R5383 Other fatigue: Secondary | ICD-10-CM

## 2023-09-18 DIAGNOSIS — R269 Unspecified abnormalities of gait and mobility: Secondary | ICD-10-CM | POA: Diagnosis not present

## 2023-09-18 DIAGNOSIS — G912 (Idiopathic) normal pressure hydrocephalus: Secondary | ICD-10-CM

## 2023-09-18 DIAGNOSIS — R5381 Other malaise: Secondary | ICD-10-CM

## 2023-09-18 NOTE — Progress Notes (Signed)
[facility]  PATIENT: Erica Noble DOB: 1960/08/05  REFERRING DOCTOR OR PCP: Cristopher Peru MD (neurology); Julieanne Manson, MD (PCP)  SOURCE: Patient, notes from Dr. Sherryll Burger, imaging and lab reports, MRI images personally reviewed.  _________________________________   HISTORICAL  CHIEF COMPLAINT:  Chief Complaint  Patient presents with   Follow-up    Pt in 10 with husband Pt here for MS f/u  husband states pt walking and gait has declined Pt states constipation     HISTORY OF PRESENT ILLNESS:  Erica Noble is a 63 y.o. woman with multiple sclerosis.  UPDATE 09/18/2023: She started Ocrevus August 2023 and last one August 2024.   She was going to have another infusion in January but became upset that her husband could not sit with her.     Her husband feels memory and mobility have worseed since starting the  Ocrevus.  She had her infusion in August 2023.      Gait is poor and she can walk about 100 steps slowly with a Rollator (cane around home).    She needs a chair for longer distance.  Her gait is shuffling and this has worsened.   She has no falls since summer 2024.    Her left leg is weaker than the right.  Hands are strong.   She notes some edema in feet/ankles.      No significant spasticity in legs.    She has urinary urgency/frequency.  She has rare accidents.  Nocturia occurs once a night.  She has hesitancy and does not always empty.   She is not on any bladder medication.      She feels her vision is good.  She has a lot of fatigue, worse as day goes on and with heat.   She notes reduced attention/focus.   She has mild cognitive issues with reduced processing and word finding at times.    Mood is better since getting a dog..    She sleeps well most nights.   She sleeps 6-7 hours of sleep.   She does not snore.     MS History: She was diagnosed with MS in 2008 after earlier presenting with diplopia and then having HA and visual change    An MRI was c/w  MS.     She had enhancng lesions.   She was referred to Dr, Ellene Route and was placed on Rebif.   She had a major relapse 6 months later affecting gait/balance and she received 5 days of IV Solu-medrol.   She started to see Dr. Leotis Shames at [facility] and was placed in the Campath trial and placed on drug x 2 years.   She was in the OLE as well.  Around 2012 or 2013, there were additional foci on MRi and a re-dose was discussed   The next MRI showed no new foci and they decided against futher re-dose.   Around 2019 she started a cane and walker.   Ocrevus was started in fall 2023.    She was adopted so does not know FH.      Imaging: MRI of the brain dated 04/07/2007.  It shows multiple T2/FLAIR hyperintense foci in the periventricular, juxtacortical and deep white matter.  Foci also noted in the left cerebellum, left greater than right middle cerebellar peduncles, left thalamus.  That study also showed enhancement of some of the foci in the hemispheres and 1 small focus in the left cerebellum.  MRI of the brain 09/29/2019 by  report by report showed additional foci and atrophy compared to 2008  MRI 01/17/2022 showed subtle T2 hyperintense signal centrally adjacent to C2-C3, and towards the left adjacent to C3-C4 through C4-C5. Although not included in the report in 2021, these foci appear to be present on the 2021 MRI.Marland Kitchen    Some DJD at C4C5 ad C5C6  MRI brain 01/17/2022 showed Moderate generalized cortical atrophy.  The ventriculomegaly is mildly out of proportion to the extent of atrophy and there could be an element of communicating hydrocephalus.  However, the ventricle size is stable compared to the 2021 MRI.     Extensive T2/FLAIR hyperintense foci in the hemispheres consistent with chronic demyelinating plaque associated with multiple sclerosis.  This is stable compared to the 2021 MRI.     No acute findings.  Normal enhancement pattern.    REVIEW OF SYSTEMS: Constitutional: No fevers, chills, sweats, or  change in appetite Eyes: No visual changes, double vision, eye pain Ear, nose and throat: No hearing loss, ear pain, nasal congestion, sore throat Cardiovascular: No chest pain, palpitations Respiratory:  No shortness of breath at rest or with exertion.   No wheezes GastrointestinaI: No nausea, vomiting, diarrhea, abdominal pain, fecal incontinence Genitourinary:  No dysuria, urinary retention or frequency.  No nocturia. Musculoskeletal:  No neck pain, back pain Integumentary: No rash, pruritus, skin lesions Neurological: as above Psychiatric: No depression at this time.  No anxiety Endocrine: No palpitations, diaphoresis, change in appetite, change in weigh or increased thirst.  She had surgery for Hashimoto's thyroiditis and is on Synthroid Hematologic/Lymphatic:  No anemia, purpura, petechiae. Allergic/Immunologic: No itchy/runny eyes, nasal congestion, recent allergic reactions, rashes  ALLERGIES: Allergies  Allergen Reactions   Epinephrine Shortness Of Breath and Swelling    During dental procedure     HOME MEDICATIONS:  Current Outpatient Medications:    cholecalciferol (VITAMIN D3) 25 MCG (1000 UNIT) tablet, Take 1,000 Units by mouth daily., Disp: , Rfl:    Cyanocobalamin (VITAMIN B 12 PO), Take by mouth., Disp: , Rfl:    doxycycline (MONODOX) 100 MG capsule, Take 1 capsule (100 mg total) by mouth 2 (two) times daily. Take with food as directed, Disp: 60 capsule, Rfl: 0   polyethylene glycol powder (GLYCOLAX/MIRALAX) 17 GM/SCOOP powder, Take 17 g by mouth 2 (two) times daily as needed for mild constipation or moderate constipation. (Patient taking differently: Take 17 g by mouth as needed for mild constipation or moderate constipation.), Disp: 3350 g, Rfl: 1   senna (SENOKOT) 8.6 MG tablet, Take 1 tablet (8.6 mg total) by mouth daily. (Patient taking differently: Take 1 tablet by mouth as needed.), Disp: 60 tablet, Rfl: 3   SYNTHROID 125 MCG tablet, Take 1 tablet (125 mcg total)  by mouth daily before breakfast., Disp: 90 tablet, Rfl: 3   aspirin EC 81 MG tablet, Take 81 mg by mouth daily. (Patient not taking: Reported on 09/18/2023), Disp: , Rfl:   PAST MEDICAL HISTORY: Past Medical History:  Diagnosis Date   Allergy 05/26/2009   Allergic rhinitis   Basal cell carcinoma 01/17/2010   left upper lip/Moh's Dr. Adriana Simas   Cancer [facility])    skin ca   Depression    H/O thyroidectomy 2006   Heart murmur    History of dysplastic nevus 12/11/2007   mid back/severe   Hyperlipidemia    Multiple sclerosis (HCC)    2008 diagnosis   Thyroid disease     PAST SURGICAL HISTORY: Past Surgical History:  Procedure Laterality Date  BREAST BIOPSY Right 2014   bx/clip- neg   CHOLECYSTECTOMY  2000   COLONOSCOPY WITH PROPOFOL N/A 11/14/2022   Procedure: COLONOSCOPY WITH PROPOFOL;  Surgeon: Toney Reil, MD;  Location: [facility];  Service: Endoscopy;  Laterality: N/A;   history of thyroidectomy  2008   POLYPECTOMY  11/14/2022   Procedure: POLYPECTOMY;  Surgeon: Toney Reil, MD;  Location: [facility];  Service: Endoscopy;;    FAMILY HISTORY: Family History  Adopted: Yes  Problem Relation Age of Onset   Breast cancer Neg Hx    Multiple sclerosis Neg Hx     SOCIAL HISTORY:  Social History   Socioeconomic History   Marital status: Married    Spouse name: Not on file   Number of children: Not on file   Years of education: Not on file   Highest education level: Bachelor's degree (e.g., BA, AB, BS)  Occupational History   Not on file  Tobacco Use   Smoking status: Former    Current packs/day: 0.00    Types: Cigarettes    Quit date: 07/29/1986    Years since quitting: 37.1   Smokeless tobacco: Never  Vaping Use   Vaping status: Never Used  Substance and Sexual Activity   Alcohol use: Yes    Alcohol/week: 1.0 standard drink of alcohol    Types: 1 Glasses of wine per week    Comment: occ   Drug use: No   Sexual activity: Not on file   Other Topics Concern   Not on file  Social History Narrative   Caffeine tea 1.5 glasses daily. Education:  BA. Work previous Advertising account planner.   Disability with [facility].   Right handed   Lives at home with husband   Social Drivers of Health   Financial Resource Strain: Low Risk  (12/05/2022)   Overall Financial Resource Strain (CARDIA)    Difficulty of Paying Living Expenses: Not very hard  Food Insecurity: Food Insecurity Present (12/05/2022)   Hunger Vital Sign    Worried About Running Out of Food in the Last Year: Sometimes true    Ran Out of Food in the Last Year: Sometimes true  Transportation Needs: No Transportation Needs (12/05/2022)   PRAPARE - Administrator, Civil Service (Medical): No    Lack of Transportation (Non-Medical): No  Physical Activity: Unknown (12/05/2022)   Exercise Vital Sign    Days of Exercise per Week: Patient declined    Minutes of Exercise per Session: Not on file  Stress: No Stress Concern Present (12/05/2022)   [employer] of Occupational Health - Occupational Stress Questionnaire    Feeling of Stress : Only a little  Social Connections: Unknown (12/05/2022)   Social Connection and Isolation Panel [NHANES]    Frequency of Communication with Friends and Family: Patient declined    Frequency of Social Gatherings with Friends and Family: Patient declined    Attends Religious Services: Patient declined    Database administrator or Organizations: Patient declined    Attends Banker Meetings: Not on file    Marital Status: Married  Intimate Partner Violence: Not on file     PHYSICAL EXAM  Vitals:   09/18/23 1436  BP: (!) 144/94  Pulse: 64  Weight: 233 lb (105.7 kg)  Height: 5\' 5"  (1.651 m)     Body mass index is 38.77 kg/m.   General: The patient is well-developed and well-nourished and in no acute distress  HEENT:  Head  is [place]/AT.  Sclera are anicteric.   Skin: Extremities are without rash or   edema.  Neurologic Exam  Mental status: The patient is alert and oriented x 3 at the time of the examination. The patient has apparent normal recent and remote memory, with an apparently normal attention span and concentration ability.   Speech is normal.  Cranial nerves: Extraocular movements are full. Facial strength and sensation were symmetric.  Marland Kitchen No obvious hearing deficits are noted.  Motor:  Muscle bulk is normal.   Tone is increased in legs, left greater than right.  Strength was 4-4+/5 in the legs, a little worse on the left compared to the right..   Sensory: Sensory testing is intact to pinprick, soft touch and vibration sensation in arms and reduced vibration in legs, relative to the arms.  Coordination: Cerebellar testing reveals good finger-nose-finger and poor heel-to-shin .  Gait and station: She needs support to stand up but can stay standing without support.  Gait is poor and she has a markedly reduced stride with magnetic shuffling.  Romberg was negative.  Reflexes: Deep tendon reflexes are symmetric and normal in arms and knees    DIAGNOSTIC DATA (LABS, IMAGING, TESTING) - I reviewed patient records, labs, notes, testing and imaging myself where available.  Lab Results  Component Value Date   WBC 6.8 07/03/2023   HGB 14.0 07/03/2023   HCT 42.3 07/03/2023   MCV 98 (H) 07/03/2023   PLT 224 07/03/2023      Component Value Date/Time   NA 143 07/03/2023 0842   K 4.2 07/03/2023 0842   CL 106 07/03/2023 0842   CO2 24 07/03/2023 0842   GLUCOSE 117 (H) 07/03/2023 0842   BUN 15 07/03/2023 0842   CREATININE 0.81 07/03/2023 0842   CALCIUM 9.2 07/03/2023 0842   PROT 6.7 07/03/2023 0842   ALBUMIN 4.3 07/03/2023 0842   AST 16 07/03/2023 0842   ALT 16 07/03/2023 0842   ALKPHOS 72 07/03/2023 0842   BILITOT 0.4 07/03/2023 0842   GFRNONAA 82 04/28/2019 1631   GFRAA 95 04/28/2019 1631   Lab Results  Component Value Date   CHOL 183 07/03/2023   HDL 60 07/03/2023    LDLCALC 110 (H) 07/03/2023   TRIG 68 07/03/2023   CHOLHDL 3.1 07/03/2023   Lab Results  Component Value Date   HGBA1C 5.6 07/03/2023   Lab Results  Component Value Date   VITAMINB12 532 07/03/2023   Lab Results  Component Value Date   TSH 1.180 07/03/2023       ASSESSMENT AND PLAN  Multiple sclerosis (HCC) - Plan: Hepatitis B surface antigen, HIV Antibody (routine testing w rflx), QuantiFERON-TB Gold Plus, Hepatitis B core antibody, total, Hepatitis C antibody, Comprehensive metabolic panel, CBC with Differential/Platelet  High risk medication use - Plan: Hepatitis B surface antigen, HIV Antibody (routine testing w rflx), QuantiFERON-TB Gold Plus, Hepatitis B core antibody, total, Hepatitis C antibody, Comprehensive metabolic panel, CBC with Differential/Platelet  Gait disturbance  Malaise and fatigue  Normal pressure hydrocephalus (HCC)  Encounter for screening for HIV - Plan: HIV Antibody (routine testing w rflx)   We had a discussion about the natural history of MS.  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  The MRI in 2021 had showed additional lesions and the MRI in 2023 was stable.  We discussed the risks and benefits of being on Ocrevus and also discussed other options.  Because of her age, we may want to switch  to a safer medication.  We discussed Mavenclad as she would like to get a long-term benefit and there would be a good chance that she would not need to be on any DMT medications afterwards  I am concerned about possible normal pressure hydrocephalus --- the third and lateral ventricles are enlarged out of proportion to the extent of atrophy and the sylvian fissures are widened while there is crowding at the vertex --- a pattern typical for this disorder.  I discussed with her and her husband that her progressively worsening shuffling gait could be due to combination of the 2 processes with the more recent changes being due to NPH.  I recommend  a high-volume lumbar puncture to further evaluate.  She would prefer not to do a lumbar puncture but will give this further thought. Rtc 6 months  48-minute office visit with the majority of the time spent face-to-face for history and physical, discussion/counseling and decision-making.  Additional time with record review and documentation.     Cataleyah Colborn A. Epimenio Foot, MD, [facility] 09/18/2023, 4:43 PM Certified in Neurology, Clinical Neurophysiology, Sleep Medicine and Neuroimaging  [facility] [address] [place], [place] [address] [phone]

## 2023-09-23 LAB — CBC WITH DIFFERENTIAL/PLATELET
Basophils Absolute: 0.1 10*3/uL (ref 0.0–0.2)
Basos: 1 %
EOS (ABSOLUTE): 0.1 10*3/uL (ref 0.0–0.4)
Eos: 2 %
Hematocrit: 41.2 % (ref 34.0–46.6)
Hemoglobin: 14.4 g/dL (ref 11.1–15.9)
Immature Grans (Abs): 0 10*3/uL (ref 0.0–0.1)
Immature Granulocytes: 0 %
Lymphocytes Absolute: 1.9 10*3/uL (ref 0.7–3.1)
Lymphs: 29 %
MCH: 32.8 pg (ref 26.6–33.0)
MCHC: 35 g/dL (ref 31.5–35.7)
MCV: 94 fL (ref 79–97)
Monocytes Absolute: 0.7 10*3/uL (ref 0.1–0.9)
Monocytes: 10 %
Neutrophils Absolute: 3.7 10*3/uL (ref 1.4–7.0)
Neutrophils: 58 %
Platelets: 213 10*3/uL (ref 150–450)
RBC: 4.39 x10E6/uL (ref 3.77–5.28)
RDW: 12 % (ref 11.7–15.4)
WBC: 6.4 10*3/uL (ref 3.4–10.8)

## 2023-09-23 LAB — COMPREHENSIVE METABOLIC PANEL
ALT: 13 [IU]/L (ref 0–32)
AST: 14 [IU]/L (ref 0–40)
Albumin: 4.3 g/dL (ref 3.9–4.9)
Alkaline Phosphatase: 77 [IU]/L (ref 44–121)
BUN/Creatinine Ratio: 15 (ref 12–28)
BUN: 11 mg/dL (ref 8–27)
Bilirubin Total: 0.4 mg/dL (ref 0.0–1.2)
CO2: 24 mmol/L (ref 20–29)
Calcium: 9.1 mg/dL (ref 8.7–10.3)
Chloride: 105 mmol/L (ref 96–106)
Creatinine, Ser: 0.73 mg/dL (ref 0.57–1.00)
Globulin, Total: 2.4 g/dL (ref 1.5–4.5)
Glucose: 93 mg/dL (ref 70–99)
Potassium: 4.1 mmol/L (ref 3.5–5.2)
Sodium: 142 mmol/L (ref 134–144)
Total Protein: 6.7 g/dL (ref 6.0–8.5)
eGFR: 93 mL/min/{1.73_m2} (ref >59)

## 2023-09-23 LAB — HEPATITIS B CORE ANTIBODY, TOTAL: Hep B Core Total Ab: NEGATIVE

## 2023-09-23 LAB — QUANTIFERON-TB GOLD PLUS
QuantiFERON Nil Value: 0.13 [IU]/mL
QuantiFERON TB1 Ag Value: 0.15 [IU]/mL
QuantiFERON TB2 Ag Value: 0.14 [IU]/mL

## 2023-09-23 LAB — HEPATITIS C ANTIBODY: Hep C Virus Ab: NONREACTIVE

## 2023-09-23 LAB — HEPATITIS B SURFACE ANTIGEN: Hepatitis B Surface Ag: NEGATIVE

## 2023-09-23 LAB — HIV ANTIBODY (ROUTINE TESTING W REFLEX): HIV Screen 4th Generation wRfx: NONREACTIVE

## 2023-09-24 ENCOUNTER — Telehealth: Payer: Self-pay | Admitting: *Deleted

## 2023-09-24 NOTE — Telephone Encounter (Signed)
Faxed completed/signed Mavenclad start form to [employer] at [phone]. Received fax confirmation.

## 2023-09-24 NOTE — Telephone Encounter (Signed)
 Called pt. Relayed lab results per Dr. Epimenio Foot. Pt verbalized understanding and would like to move forward with starting Mavenclad. Explained start form will be sent to [employer] and she should be on look out for call from them. Aware myself and Baxter Hire, RN will be contacting her in interim as well with any questions/updates. Start form completed, pending MD signature.

## 2023-09-25 NOTE — Telephone Encounter (Signed)
 Received fax that PA needed. CMM Key: [facility]

## 2023-09-29 NOTE — Telephone Encounter (Signed)
 Completed PA for Mavenclad via CMM and sent to [facility]. Key: [id number]. Should have a determination within 3-5 business days.

## 2023-10-02 NOTE — Telephone Encounter (Signed)
 Called [employer]. They confirmed they received form. They will need to do call w/ pt and [facility] to do benefit verification. I relayed approval info. They will forward to pharmacy. They will also call pt to relay drug approved. They spoke w/ pt 09/26/23 to do welcome call.

## 2023-10-08 ENCOUNTER — Telehealth: Payer: Self-pay | Admitting: Family Medicine

## 2023-10-08 ENCOUNTER — Other Ambulatory Visit: Payer: Self-pay | Admitting: Family Medicine

## 2023-10-08 NOTE — Telephone Encounter (Signed)
 [facility] pharmacy is requesting refill SYNTHROID 125 MCG tablet  Please advise

## 2023-10-09 NOTE — Telephone Encounter (Signed)
 Called but was only able to leave a voice message informing the pt of the requested medication, Synthroid has been approved and sent to the pharmacy on file.

## 2023-10-09 NOTE — Telephone Encounter (Addendum)
 Called [employer] at [phone] and spoke w/ Baxter Hire. Rx sent to [facility] specialty pharmacy 10/03/23.  They tried to call pt but had to LVM.  Called [facility] at [phone]. Spoke w/ Albin Felling. They confirmed they received rx, 100.00 copay. They have not heard from pt. They tried calling her on 10/06/23. Ready to schedule delivery. Aware I will try to call pt.  I called pt at [phone]. LVM for her to call office.

## 2023-10-09 NOTE — Telephone Encounter (Signed)
 Requested Prescriptions  Pending Prescriptions Disp Refills   SYNTHROID 125 MCG tablet [Pharmacy Med Name: SYNTHROID 0.125MG  ( ) TABLETS] 90 tablet 1    Sig: TAKE 1 TABLET(125 MCG) BY MOUTH DAILY BEFORE BREAKFAST     Endocrinology:  Hypothyroid Agents Passed - 10/09/2023  8:05 AM      Passed - TSH in normal range and within 360 days    TSH  Date Value Ref Range Status  07/03/2023 1.180 0.450 - 4.500 uIU/mL Final         Passed - Valid encounter within last 12 months    Recent Outpatient Visits           3 months ago Annual physical exam   [place] [facility] Simmons-Robinson, Rafael Hernandez, MD   10 months ago Constipation due to neurogenic bowel   [place] [facility] Simmons-Robinson, [place], MD   1 year ago Frequent urination   [place] [facility] [place], [place], MD   1 year ago Frequent urination    [facility], Tawanna Cooler, MD   1 year ago Annual physical exam   [facility] [place]., MD       Future Appointments             In 1 week Simmons-Robinson, Tawanna Cooler, MD [facility]   In 10 months Elie Goody, MD [facility]

## 2023-10-17 ENCOUNTER — Encounter: Payer: Self-pay | Admitting: Family Medicine

## 2023-10-17 ENCOUNTER — Ambulatory Visit: Payer: Managed Care, Other (non HMO) | Admitting: Family Medicine

## 2023-10-17 VITALS — BP 128/61 | HR 62 | Ht 65.0 in | Wt 232.1 lb

## 2023-10-17 DIAGNOSIS — E66812 Obesity, class 2: Secondary | ICD-10-CM

## 2023-10-17 DIAGNOSIS — G35 Multiple sclerosis: Secondary | ICD-10-CM | POA: Diagnosis not present

## 2023-10-17 DIAGNOSIS — Z6835 Body mass index (BMI) 35.0-35.9, adult: Secondary | ICD-10-CM

## 2023-10-17 DIAGNOSIS — E039 Hypothyroidism, unspecified: Secondary | ICD-10-CM | POA: Diagnosis not present

## 2023-10-17 MED ORDER — SYNTHROID 125 MCG PO TABS: 125.0000 ug | ORAL_TABLET | Freq: Every day | ORAL | 2 refills | Status: DC

## 2023-10-17 NOTE — Telephone Encounter (Signed)
 Pt is asking for a call to discuss the Mavenclad with Kara Mead, RN

## 2023-10-17 NOTE — Telephone Encounter (Signed)
 Called pt at [phone]. LVM for her to call office. Trying to see if she has received/started on Mavenclad.

## 2023-10-17 NOTE — Progress Notes (Signed)
 Established patient visit   Patient: Erica Noble   DOB: [birth year]   63 y.o. Female  MRN: [id number] Visit Date: 10/17/2023  Today's healthcare provider: Ronnald Ramp, MD   Chief Complaint  Patient presents with   Follow-up    Needs meds refilled    Weight Loss    Discuss weight loss treatment    Subjective     HPI     Follow-up    Additional comments: Needs meds refilled         Weight Loss    Additional comments: Discuss weight loss treatment       Last edited by Thedora Hinders, CMA on 10/17/2023  3:27 PM.       Discussed the use of AI scribe software for clinical note transcription with the patient, who gave verbal consent to proceed.  History of Present Illness Erica Noble is a 63 year old female who presents with concerns about weight management. She is accompanied by her husband, Onalee Hua.  She is concerned about her weight and prefers the term 'weight management' over 'morbidly obese'. She acknowledges the need for portion control and exercise but is apprehensive about the side effects of newer weight loss medications like Ozempic and Wegovy, particularly due to her ongoing constipation.  She recalls a previous weight loss experience in [place] involving daily weighing, a pill, and monitoring food intake, primarily chicken and tuna, which resulted in weight loss. She cannot recall the program's name. She previously consulted a weight management doctor in [place], who advised portion control and prescribed metformin, which she did not find helpful.  She was prescribed metformin in the past but did not take it, preferring to research medications first. Her father believed metformin extended his life despite disliking it. She is currently on Synthroid 125 mcg, last checked in December and found stable. She is due for a refill, requiring a doctor's approval.  She has ongoing constipation, managed with daily polyethylene glycol  (MiraLax) as recommended by a previous doctor. She is concerned that weight loss medications may worsen her constipation.  She has a history of multiple sclerosis, which complicates her ability to exercise and may contribute to her constipation.     Past Medical History:  Diagnosis Date   Allergy 05/26/2009   Allergic rhinitis   Basal cell carcinoma 01/17/2010   left upper lip/Moh's Dr. Adriana Simas   Cancer [facility])    skin ca   Depression    H/O thyroidectomy 2006   Heart murmur    History of dysplastic nevus 12/11/2007   mid back/severe   Hyperlipidemia    Multiple sclerosis (HCC)    2008 diagnosis   Thyroid disease     Medications: Outpatient Medications Prior to Visit  Medication Sig   aspirin EC 81 MG tablet Take 81 mg by mouth daily.   cholecalciferol (VITAMIN D3) 25 MCG (1000 UNIT) tablet Take 1,000 Units by mouth daily.   Cyanocobalamin (VITAMIN B 12 PO) Take by mouth.   polyethylene glycol powder (GLYCOLAX/MIRALAX) 17 GM/SCOOP powder Take 17 g by mouth 2 (two) times daily as needed for mild constipation or moderate constipation. (Patient taking differently: Take 17 g by mouth as needed for mild constipation or moderate constipation.)   senna (SENOKOT) 8.6 MG tablet Take 1 tablet (8.6 mg total) by mouth daily. (Patient taking differently: Take 1 tablet by mouth as needed.)   [DISCONTINUED] SYNTHROID 125 MCG tablet TAKE 1 TABLET(125 MCG) BY MOUTH DAILY BEFORE BREAKFAST  doxycycline (MONODOX) 100 MG capsule Take 1 capsule (100 mg total) by mouth 2 (two) times daily. Take with food as directed (Patient not taking: Reported on 10/17/2023)   No facility-administered medications prior to visit.    Review of Systems  Last CBC Lab Results  Component Value Date   WBC 6.4 09/18/2023   HGB 14.4 09/18/2023   HCT 41.2 09/18/2023   MCV 94 09/18/2023   MCH 32.8 09/18/2023   RDW 12.0 09/18/2023   PLT 213 09/18/2023   Last metabolic panel Lab Results  Component Value Date    GLUCOSE 93 09/18/2023   NA 142 09/18/2023   K 4.1 09/18/2023   CL 105 09/18/2023   CO2 24 09/18/2023   BUN 11 09/18/2023   CREATININE 0.73 09/18/2023   EGFR 93 09/18/2023   CALCIUM 9.1 09/18/2023   PROT 6.7 09/18/2023   ALBUMIN 4.3 09/18/2023   LABGLOB 2.4 09/18/2023   AGRATIO 1.8 12/09/2022   BILITOT 0.4 09/18/2023   ALKPHOS 77 09/18/2023   AST 14 09/18/2023   ALT 13 09/18/2023   Last lipids Lab Results  Component Value Date   CHOL 183 07/03/2023   HDL 60 07/03/2023   LDLCALC 110 (H) 07/03/2023   TRIG 68 07/03/2023   CHOLHDL 3.1 07/03/2023   Last hemoglobin A1c Lab Results  Component Value Date   HGBA1C 5.6 07/03/2023   Last thyroid functions Lab Results  Component Value Date   TSH 1.180 07/03/2023        Objective    BP 128/61   Pulse 62   Ht 5\' 5"  (1.651 m)   Wt 232 lb 1.6 oz (105.3 kg)   SpO2 100%   BMI 38.62 kg/m  BP Readings from Last 3 Encounters:  10/17/23 128/61  09/18/23 (!) 144/94  06/13/23 124/62   Wt Readings from Last 3 Encounters:  10/17/23 232 lb 1.6 oz (105.3 kg)  09/18/23 233 lb (105.7 kg)  06/13/23 229 lb 4.8 oz (104 kg)        Physical Exam  General: Alert, no acute distress, female appearing stated age seated in transport chair  Cardio: Normal S1 and S2, RRR, no r/m/g Pulm: CTAB, normal work of breathing   No results found for any visits on 10/17/23.  Assessment & Plan     Problem List Items Addressed This Visit       Endocrine   Acquired hypothyroidism - Primary   Relevant Medications   SYNTHROID 125 MCG tablet     Nervous and Auditory   Multiple sclerosis (HCC)     Other   Adiposity     Assessment & Plan Adiposity, BMI 38.62 She is concerned about her weight and has tried various weight loss methods, including a program in [place] and metformin, which she did not take. Discussed the use of GLP-1 receptor agonists, such as Zepbound and [id number], for long-term obesity management. These medications can lead  to significant weight loss (15-20%) but may cause side effects like nausea and constipation. Her history of constipation may be exacerbated by these medications. Metformin was discussed as an adjunct therapy, potentially aiding in modest weight loss (2-3%) and alleviating constipation if it causes diarrhea. Emphasized the chronic nature of obesity treatment and the potential for weight regain if treatment is discontinued. - Consider Zepbound or [phone] for weight management if insurance covers it and no contraindications are present, such as a history of medullary thyroid cancer or pancreatitis. - Consider metformin as an adjunct therapy for weight management  and constipation. - Consult with a nutritionist for dietary management.  Constipation, chronic Constipation is a concern when considering weight loss medications that may exacerbate this condition. Discussed the use of polyethylene glycol (MiraLax) and the potential for metformin to help with constipation if it causes diarrhea. - Continue polyethylene glycol (MiraLax) for constipation management. - Consider metformin if constipation persists and weight loss medication is initiated.  Hypothyroidism Chronic, stable  She is on Synthroid 125 mcg for hypothyroidism, and her recent labs in December were well-managed. She is due for a refill of Synthroid. - Refill Synthroid 125 mcg prescription.  MS  Chronic  Considering new therapy with neurology, patient and her husband have been considering potential side effects for a new agent    Return in about 5 months (around 03/18/2024) for CHRONIC F/U.         Ronnald Ramp, MD  [facility] [phone] (phone) [phone] (fax)  [facility]

## 2023-10-20 NOTE — Telephone Encounter (Signed)
 Called pt. She received Mavenclad. She plans on starting Y1M1 10/23/23.  She will make sure to call specialty pharmacy to set up shipment for month 2. She will call if she has any questions moving forward.

## 2023-10-29 NOTE — Telephone Encounter (Signed)
 Called and LVM for pt to call office. Wanting to confirm she started [facility] and if she has, is she tolerating ok?  Also wanting to confirm that she set up shipment for month 2 of Mavenclad via specialty pharmacy.

## 2023-11-18 NOTE — Telephone Encounter (Addendum)
 LVM for pt to call at [phone].  Called husband (on [place]) at [phone]. Number not in service.

## 2023-11-18 NOTE — Telephone Encounter (Signed)
 LVM for pt to call office

## 2023-11-19 NOTE — Telephone Encounter (Signed)
 Pt returned phone call, transferred to [facility]

## 2023-11-19 NOTE — Telephone Encounter (Signed)
 Letter shredded per Starling Eck since she took call.

## 2023-11-19 NOTE — Telephone Encounter (Signed)
 I called patient at [phone]. No answer, left a message asking her to call us  back. This is our third unsuccessful attempt at reaching patient via phone. Will mail her a letter asking her to call us  back.

## 2023-11-19 NOTE — Telephone Encounter (Signed)
 Patient returned my call.  She reports that she is reluctant to start Mavenclad.  She is worried about potential side effects.  She has medication on hand.  She reports that she would like to think about this further.  She will call us  when she starts Mavenclad to let us  know how she tolerated it.  I offered to send specific questions or concerns to Dr. Godwin Lat for further discussion, but patient declined at this time.  She confirms that she will call us  when she starts the [facility].  She declined my offer of calling her next month to check on her.

## 2023-12-11 ENCOUNTER — Other Ambulatory Visit: Payer: Self-pay | Admitting: Obstetrics and Gynecology

## 2023-12-11 DIAGNOSIS — Z1231 Encounter for screening mammogram for malignant neoplasm of breast: Secondary | ICD-10-CM

## 2023-12-19 LAB — HM PAP SMEAR

## 2023-12-19 LAB — RESULTS CONSOLE HPV: CHL HPV: NEGATIVE

## 2023-12-25 ENCOUNTER — Other Ambulatory Visit: Payer: Self-pay | Admitting: Family Medicine

## 2023-12-25 DIAGNOSIS — E039 Hypothyroidism, unspecified: Secondary | ICD-10-CM

## 2023-12-30 ENCOUNTER — Telehealth: Payer: Self-pay

## 2023-12-30 NOTE — Telephone Encounter (Unsigned)
 Copied from CRM [id number]. Topic: General - Other >> Dec 30, 2023  9:21 AM Carlatta H wrote: Reason for CRM: Patients husband dropped DMV paperwork for placard//She is unaware of the date but would like to know if that has been completed//Please call to advise the paperwork//

## 2023-12-31 NOTE — Telephone Encounter (Signed)
 Called to notify patient that paperwork was ready to be picked up had to leave message on voicemail 12/31/23 @ 11:34am

## 2023-12-31 NOTE — Telephone Encounter (Signed)
 Form is completed and ready for patient to pick up from [address]

## 2024-01-27 NOTE — Telephone Encounter (Signed)
 PA for Mavenclad month 2 sent to [facility] via CMM . Key: [id number]. Should have determination within 3-5 business days.

## 2024-01-27 NOTE — Telephone Encounter (Signed)
 It appears patient may have started Mavenclad.  I called patient to discuss if this is true and if so, how she tolerated it.  No answer, left a message asking her to call us  back. Will also send mychart message.

## 2024-01-29 NOTE — Telephone Encounter (Signed)
 Javie at [facility] [employer] notified of this PA approval.

## 2024-01-29 NOTE — Telephone Encounter (Signed)
 I called patient to discuss how her M1 of Mavenclad went and to get her scheduled for a 2 month follow up. No answer, left a message asking her to call us  back.

## 2024-02-11 NOTE — Telephone Encounter (Signed)
 Spoke w/ Javie/[employer]. She spoke w/ spec pharmacy and they confirmed they are getting paid claim. They will call pt to schedule delivery. She also called and LVM for pt.

## 2024-02-11 NOTE — Telephone Encounter (Signed)
 Took call from phone room and spoke w/ Javie/[employer]. Relayed PA approved for M2 Mavenclad. Aware pt losing insurance end of July. She will call pt to help her set up shipment via [employer] SP for M2 before coverage lost.

## 2024-02-16 ENCOUNTER — Encounter: Payer: Self-pay | Admitting: Dermatology

## 2024-02-16 ENCOUNTER — Ambulatory Visit: Admitting: Dermatology

## 2024-02-16 DIAGNOSIS — L820 Inflamed seborrheic keratosis: Secondary | ICD-10-CM

## 2024-02-16 NOTE — Telephone Encounter (Signed)
 I have let Javie at [facility] know that patient has opted not to continue with Mavenclad.

## 2024-02-16 NOTE — Progress Notes (Signed)
   Follow-Up Visit   Subjective  Erica Noble is a 63 y.o. female who presents for the following: mole at left neck that has become bothersome.   Patient accompanied by husband who contributes to history.   The following portions of the chart were reviewed this encounter and updated as appropriate: medications, allergies, medical history  Review of Systems:  No other skin or systemic complaints except as noted in HPI or Assessment and Plan.  Objective  Well appearing patient in no apparent distress; mood and affect are within normal limits.   A focused examination was performed of the following areas: neck  Relevant exam findings are noted in the Assessment and Plan.  left neck Erythematous stuck-on, waxy papule or plaque  Assessment & Plan   SEBORRHEIC KERATOSIS - Stuck-on, waxy, tan-brown papules and/or plaques  - Benign-appearing - Discussed benign etiology and prognosis. - Observe - Call for any changes     INFLAMED SEBORRHEIC KERATOSIS left neck Symptomatic, irritating, patient would like treated.  Benign-appearing.  Call clinic for new or changing lesions.   Destruction of lesion - left neck Complexity: simple   Destruction method: cryotherapy   Informed consent: discussed and consent obtained   Timeout:  patient name, date of birth, surgical site, and procedure verified Lesion destroyed using liquid nitrogen: Yes   Region frozen until ice ball extended beyond lesion: Yes   Cryotherapy cycles:  2 (1 or 2) Outcome: patient tolerated procedure well with no complications   Post-procedure details: wound care instructions given     Return for TBSE, as scheduled, with Dr. Claudene, [facility], [facility].  LILLETTE Lonell Drones, RMA, am acting as scribe for Boneta Claudene, MD .   Documentation: I have reviewed the above documentation for accuracy and completeness, and I agree with the above.  Boneta Claudene, MD

## 2024-02-16 NOTE — Patient Instructions (Addendum)

## 2024-03-19 ENCOUNTER — Encounter: Payer: Self-pay | Admitting: Family Medicine

## 2024-03-19 ENCOUNTER — Other Ambulatory Visit: Payer: Self-pay

## 2024-03-19 ENCOUNTER — Ambulatory Visit: Admitting: Family Medicine

## 2024-03-19 VITALS — BP 121/72 | HR 59 | Resp 16

## 2024-03-19 DIAGNOSIS — K5909 Other constipation: Secondary | ICD-10-CM | POA: Diagnosis not present

## 2024-03-19 DIAGNOSIS — K592 Neurogenic bowel, not elsewhere classified: Secondary | ICD-10-CM

## 2024-03-19 DIAGNOSIS — K5900 Constipation, unspecified: Secondary | ICD-10-CM | POA: Diagnosis not present

## 2024-03-19 MED ORDER — POLYETHYLENE GLYCOL 3350 17 GM/SCOOP PO POWD: 17.0000 g | Freq: Two times a day (BID) | ORAL | 3 refills | Status: DC | PRN

## 2024-03-19 NOTE — Assessment & Plan Note (Signed)
 Chronic  Improved with PEG  Refilled provided miralax 

## 2024-03-19 NOTE — Progress Notes (Addendum)
 Established patient visit   Patient: Erica Noble   DOB: 1960/12/16   63 y.o. Female  MRN: [id number] Visit Date: 03/19/2024  Today's healthcare provider: Rockie Agent, MD   Chief Complaint  Patient presents with   Follow-up    5 Month F/u.SABRA Pt wants to discuss  thyroid  and Vaccines?   Subjective     HPI     Follow-up    Additional comments: 5 Month F/u.SABRA Pt wants to discuss  thyroid  and Vaccines?      Last edited by Marylen Odella CROME, CMA on 03/19/2024  4:07 PM.       Discussed the use of AI scribe software for clinical note transcription with the patient, who gave verbal consent to proceed.  History of Present Illness Erica Noble is a 63 year old female with hypothyroidism and multiple sclerosis who presents for a chronic follow-up to discuss thyroid  management and vaccinations.  She is concerned about her thyroid  management, specifically regarding her Synthroid  medication. She is currently taking Synthroid  125 mcg daily. Her last TSH level was 1.18 in December, approximately eight months ago, and she is unsure if her levels remain stable.  She experiences constipation and has multiple sclerosis. She previously used polyethylene glycol (PEG) but it expired, and she has been using Miralax  tablets instead, which take three days to work. She takes two tablets per day, with a maximum of four, but prefers PEG as it can be mixed with water . She experiences bowel movements every two to four days, sometimes with relief after skipping a day.  She is due for several vaccinations, including cervical cancer screening, tetanus booster, flu vaccine, shingles vaccine, and pneumococcal vaccine. She has had shingles in the past but has not received the shingles vaccine. She expresses concern about the effectiveness of vaccines due to her multiple sclerosis and has read about RSV being prevalent in her area.  She mentions a change in her prescription plan, [facility] of [facility], and discusses her insurance coverage under an affordable health care plan, with her on a bronze plan and her husband on a gold plan.  She is transitioning to cooking more at home and is trying to incorporate more green leafy vegetables into her diet. She clarified that her constipation is not related to urination.     Past Medical History:  Diagnosis Date   Allergy 05/26/2009   Allergic rhinitis   Basal cell carcinoma 01/17/2010   left upper lip/Moh's Dr. Bluford   Cancer [facility])    skin ca   Depression    H/O thyroidectomy 2006   Heart murmur    History of dysplastic nevus 12/11/2007   mid back/severe   Hyperlipidemia    Multiple sclerosis (HCC)    2008 diagnosis   Thyroid  disease     Medications: Outpatient Medications Prior to Visit  Medication Sig   aspirin EC 81 MG tablet Take 81 mg by mouth daily.   cholecalciferol (VITAMIN D3) 25 MCG (1000 UNIT) tablet Take 1,000 Units by mouth daily.   Cyanocobalamin (VITAMIN B 12 PO) Take by mouth.   doxycycline  (MONODOX ) 100 MG capsule Take 1 capsule (100 mg total) by mouth 2 (two) times daily. Take with food as directed   senna (SENOKOT) 8.6 MG tablet Take 1 tablet (8.6 mg total) by mouth daily. (Patient taking differently: Take 1 tablet by mouth as needed.)   SYNTHROID  125 MCG tablet TAKE 1 TABLET(125 MCG) BY MOUTH DAILY BEFORE  BREAKFAST   [DISCONTINUED] polyethylene glycol powder (GLYCOLAX /MIRALAX ) 17 GM/SCOOP powder Take 17 g by mouth 2 (two) times daily as needed for mild constipation or moderate constipation. (Patient taking differently: Take 17 g by mouth as needed for mild constipation or moderate constipation.)   No facility-administered medications prior to visit.    Review of Systems  Last thyroid  functions Lab Results  Component Value Date   TSH 1.180 07/03/2023        Objective    BP 121/72 (BP Location: Right Arm, Patient Position: Sitting, Cuff Size: Normal)   Pulse (!) 59   Resp 16    SpO2 98%      Physical Exam Vitals reviewed.  Constitutional:      General: She is not in acute distress.    Appearance: Normal appearance. She is not ill-appearing, toxic-appearing or diaphoretic.  HENT:     Head: Normocephalic and atraumatic.     Right Ear: Tympanic membrane and external ear normal. There is no impacted cerumen.     Left Ear: Tympanic membrane and external ear normal. There is no impacted cerumen.     Nose: Nose normal.     Mouth/Throat:     Pharynx: Oropharynx is clear.  Eyes:     General: No scleral icterus.    Extraocular Movements: Extraocular movements intact.     Conjunctiva/sclera: Conjunctivae normal.     Pupils: Pupils are equal, round, and reactive to light.  Cardiovascular:     Rate and Rhythm: Normal rate and regular rhythm.     Pulses: Normal pulses.     Heart sounds: Normal heart sounds. No murmur heard.    No friction rub. No gallop.  Pulmonary:     Effort: Pulmonary effort is normal. No respiratory distress.     Breath sounds: Normal breath sounds. No wheezing, rhonchi or rales.  Abdominal:     General: Bowel sounds are normal. There is no distension.     Palpations: Abdomen is soft. There is no mass.     Tenderness: There is no abdominal tenderness. There is no guarding.  Musculoskeletal:        General: No deformity.     Cervical back: Normal range of motion and neck supple. No rigidity.     Right lower leg: No edema.     Left lower leg: No edema.  Lymphadenopathy:     Cervical: No cervical adenopathy.  Skin:    General: Skin is warm.     Capillary Refill: Capillary refill takes less than 2 seconds.     Findings: No erythema or rash.  Neurological:     General: No focal deficit present.     Mental Status: She is alert and oriented to person, place, and time.     Cranial Nerves: Cranial nerves 2-12 are intact. No dysarthria.     Motor: Weakness present.     Gait: Gait abnormal.     Comments: 4/5 strength on right, patient is in  transport chair unable to ambulate independently in clinic  Psychiatric:        Mood and Affect: Mood normal.        Behavior: Behavior normal.       Results for orders placed or performed in visit on 03/19/24  HM PAP SMEAR  Result Value Ref Range   HM Pap smear NILM   Results Console HPV  Result Value Ref Range   CHL HPV Negative     Assessment & Plan  Problem List Items Addressed This Visit     Constipation due to neurogenic bowel   Chronic  Improved with PEG  Refilled provided miralax        Other Visit Diagnoses       Other constipation    -  Primary   Relevant Medications   polyethylene glycol powder (GLYCOLAX /MIRALAX ) 17 GM/SCOOP powder       Assessment and Plan Assessment & Plan Hypothyroidism Chronic condition  Hypothyroidism is managed with Synthroid  125 mcg. Last TSH level was 1.18 in December, approximately eight months ago. Current TSH level needs reassessment to ensure stability of thyroid  function. - Order TSH level to assess current thyroid  function.  Constipation Chronic constipation with infrequent bowel movements, sometimes skipping two days. Previous prescription for polyethylene glycol (Miralax ) was not used and expired. She has been using Miralax  tablets, which take three days to work. Potential neurogenic bowel issues related to MS may contribute to constipation. Dietary habits and lack of physical activity may also play a role. - Prescribe polyethylene glycol (Miralax ) for constipation management. - Encourage dietary modifications including increased fiber intake from sources like kale, spinach, and collard greens. - Discuss the use of prunes, prune juice, and pears as dietary aids for constipation.  General Health Maintenance She is due for cervical cancer screening, tetanus booster, flu vaccine, and shingles vaccine. Discussed potential risks and benefits of the shingles vaccine, particularly the risk of Guillain-Barre syndrome, and  decided to pause on it. Recommended flu and tetanus vaccines as they are generally well-tolerated. Discussed the prevalence of RSV and the potential benefit of vaccination, especially given her age and potential immunocompromising conditions. - Pt reports cervical cancer screening was completed at [facility] clinic on 12/19/23, confirmed results in [facility] DXA section of EMR, results were negative HPV and NILM - recommended tetanus booster and flu vaccine. - Consider RSV vaccination given current prevalence and potential benefit. - Pause on shingles vaccine due to potential risk of Guillain-Barre syndrome.     Return in about 4 months (around 07/05/2024).         Rockie Agent, MD  [facility] [phone] (phone) [phone] (fax)  [facility]

## 2024-03-19 NOTE — Patient Instructions (Addendum)
 It was a pleasure to see you today!   To keep you healthy, please keep in mind the following health maintenance items that you are due for:   Health Maintenance Due  Topic Date Due   Zoster Vaccines- Shingrix  (1 of 2) Never done   Pneumococcal Vaccine: 50+ Years (1 of 1 - PCV) Never done   DTaP/Tdap/Td (2 - Td or Tdap) 12/31/2022   COVID-19 Vaccine (6 - 2024-25 season) 03/30/2023   INFLUENZA VACCINE  02/27/2024     Best Wishes,   Dr. Lang

## 2024-03-22 ENCOUNTER — Other Ambulatory Visit: Payer: Self-pay | Admitting: Family Medicine

## 2024-03-22 DIAGNOSIS — K5909 Other constipation: Secondary | ICD-10-CM

## 2024-03-22 NOTE — Telephone Encounter (Unsigned)
 Copied from CRM #[id number]. Topic: Clinical - Medication Refill >> Mar 22, 2024  3:46 PM Marissa P wrote: Medication: polyethylene glycol powder (GLYCOLAX /MIRALAX ) 17 GM/SCOOP powder  Has the patient contacted their pharmacy? Yes (Agent: If no, request that the patient contact the pharmacy for the refill. If patient does not wish to contact the pharmacy document the reason why and proceed with request.) (Agent: If yes, when and what did the pharmacy advise?)  This is the patient's preferred pharmacy:   [phone] [address], [place] - [address] [place] [address] Phone: [phone] Fax: [phone]  Is this the correct pharmacy for this prescription? Yes If no, delete pharmacy and type the correct one.   Has the prescription been filled recently? Yes  Is the patient out of the medication? Yes  Has the patient been seen for an appointment in the last year OR does the patient have an upcoming appointment? Yes  Can we respond through MyChart? Yes  Agent: Please be advised that Rx refills may take up to 3 business days. We ask that you follow-up with your pharmacy.

## 2024-03-23 NOTE — Telephone Encounter (Signed)
 Spoke with Prentice at the pharmacy and he reports patient insurance will not cover rx as it can be purchased over the counter.  Patient husband advised. Verbalized understanding

## 2024-03-25 MED ORDER — POLYETHYLENE GLYCOL 3350 17 GM/SCOOP PO POWD: 17.0000 g | Freq: Two times a day (BID) | ORAL | 3 refills | Status: AC | PRN

## 2024-03-25 NOTE — Addendum Note (Signed)
 Addended by: SIMMONS-ROBINSON, Lessie Manigo L on: 03/25/2024 07:13 AM   Modules accepted: Orders

## 2024-04-01 ENCOUNTER — Ambulatory Visit: Payer: Managed Care, Other (non HMO) | Admitting: Neurology

## 2024-04-07 ENCOUNTER — Encounter: Payer: Self-pay | Admitting: Neurology

## 2024-04-07 ENCOUNTER — Ambulatory Visit: Admitting: Neurology

## 2024-04-07 VITALS — BP 126/67 | HR 67 | Wt 235.0 lb

## 2024-04-07 DIAGNOSIS — F09 Unspecified mental disorder due to known physiological condition: Secondary | ICD-10-CM

## 2024-04-07 DIAGNOSIS — G912 (Idiopathic) normal pressure hydrocephalus: Secondary | ICD-10-CM

## 2024-04-07 DIAGNOSIS — R5383 Other fatigue: Secondary | ICD-10-CM

## 2024-04-07 DIAGNOSIS — R269 Unspecified abnormalities of gait and mobility: Secondary | ICD-10-CM | POA: Diagnosis not present

## 2024-04-07 DIAGNOSIS — R5381 Other malaise: Secondary | ICD-10-CM | POA: Diagnosis not present

## 2024-04-07 DIAGNOSIS — G35 Multiple sclerosis: Secondary | ICD-10-CM

## 2024-04-07 DIAGNOSIS — Z79899 Other long term (current) drug therapy: Secondary | ICD-10-CM

## 2024-04-07 NOTE — Progress Notes (Signed)
 [facility]  PATIENT: Erica Noble DOB: 1960-09-03  REFERRING DOCTOR OR PCP: Jannett Fairly MD (neurology); Charlie Forte, MD (PCP)  SOURCE: Patient, notes from Dr. Fairly, imaging and lab reports, MRI images personally reviewed.  _________________________________   HISTORICAL  CHIEF COMPLAINT:  Chief Complaint  Patient presents with   RM11/MS    Erica Noble is here with Erica Noble Noble. Erica Noble states Erica Noble is having brain fog. Erica Noble states that Erica Noble is having bladder issues as well as constipation. Erica Noble states that Erica Noble has pain all over.     HISTORY OF PRESENT ILLNESS:  Erica Noble is a 63 y.o. woman with multiple sclerosis.  UPDATE 04/07/2024: Erica Noble started Ocrevus August 2023 and last one August 2024.   Erica Noble was going to have another infusion in January but became upset that Erica Noble Noble could not sit with Erica Noble.   We had gone over other options and we were going to do Mavenclad.  However, Erica Noble was concerned about the risks and ended up not taking any additional medication.  We went over some options again.  Erica Noble is willing to go on Kesimpta.  Gait is poor and Erica Noble can walk about 100 steps slowly with a Rollator (cane around home).    Erica Noble needs a chair for longer distance.  Erica Noble gait is shuffling and this has worsened.   Erica Noble has no falls since summer 2024.    Erica Noble left leg is weaker than the right.  Hands are strong.   Erica Noble notes some edema in feet/ankles.      No significant spasticity in legs.    Erica Noble has urinary urgency/frequency.  Erica Noble has very rare accidents.  Nocturia occurs once a night.  Erica Noble has hesitancy and does not always empty.    At the last visit, I had discussed my concern that Erica Noble has normal pressure hydrocephalus and that I would recommend a lumbar puncture to further evaluate and referral to neurosurgery based on results.  However, Erica Noble prefers not to do a lumbar puncture.  I have reemphasize that I believe that this test would be in Erica Noble best interest.  Erica Noble feels Erica Noble vision  is good.  Erica Noble has a lot of fatigue, worse as day goes on and with heat.   Erica Noble notes reduced attention/focus.   Erica Noble has cognitive issues with reduced processing and word finding at times.    Mood is better since getting a dog..    Erica Noble sleeps well most nights.   Erica Noble sleeps 6-7 hours of sleep.   Erica Noble does not snore.     MS History: Erica Noble was diagnosed with MS in 2008 after earlier presenting with diplopia and then having HA and visual change    An MRI was c/w MS.     Erica Noble had enhancng lesions.   Erica Noble was referred to Dr, Eliverto and was placed on Rebif.   Erica Noble had a major relapse 6 months later affecting gait/balance and Erica Noble received 5 days of IV Solu-medrol.   Erica Noble started to see Dr. Juliane at [facility] and was placed in the [facility] trial and placed on drug x 2 years.   Erica Noble was in the OLE as well.  Around 2012 or 2013, there were additional foci on MRi and a re-dose was discussed   The next MRI showed no new foci and they decided against futher re-dose.   Around 2019 Erica Noble started a cane and walker.   Ocrevus was started in fall 2023.  Due to claustrophobia in infusion  suite Erica Noble stopped the January 2025 infusion.   We had discussed Mavenclad but Erica Noble opted not to do.   Kesimpta was started Spet 2025.    Erica Noble was adopted so does not know FH.      Imaging: MRI of the brain dated 04/07/2007.  It shows multiple T2/FLAIR hyperintense foci in the periventricular, juxtacortical and deep white matter.  Foci also noted in the left cerebellum, left greater than right middle cerebellar peduncles, left thalamus.  That study also showed enhancement of some of the foci in the hemispheres and 1 small focus in the left cerebellum.  MRI of the brain 09/29/2019 by report by report showed additional foci and atrophy compared to 2008  MRI 01/17/2022 showed subtle T2 hyperintense signal centrally adjacent to C2-C3, and towards the left adjacent to C3-C4 through C4-C5. Although not included in the report in 2021, these foci appear to be  present on the 2021 MRI.SABRA    Some DJD at C4C5 ad C5C6  MRI brain 01/17/2022 showed Moderate generalized cortical atrophy.  The ventriculomegaly is mildly out of proportion to the extent of atrophy and there could be an element of communicating hydrocephalus.  However, the ventricle size is stable compared to the 2021 MRI.     Extensive T2/FLAIR hyperintense foci in the hemispheres consistent with chronic demyelinating plaque associated with multiple sclerosis.  This is stable compared to the 2021 MRI.     No acute findings.  Normal enhancement pattern.    REVIEW OF SYSTEMS: Constitutional: No fevers, chills, sweats, or change in appetite Eyes: No visual changes, double vision, eye pain Ear, nose and throat: No hearing loss, ear pain, nasal congestion, sore throat Cardiovascular: No chest pain, palpitations Respiratory:  No shortness of breath at rest or with exertion.   No wheezes GastrointestinaI: No nausea, vomiting, diarrhea, abdominal pain, fecal incontinence Genitourinary:  No dysuria, urinary retention or frequency.  No nocturia. Musculoskeletal:  No neck pain, back pain Integumentary: No rash, pruritus, skin lesions Neurological: as above Psychiatric: No depression at this time.  No anxiety Endocrine: No palpitations, diaphoresis, change in appetite, change in weigh or increased thirst.  Erica Noble had surgery for Hashimoto's thyroiditis and is on Synthroid  Hematologic/Lymphatic:  No anemia, purpura, petechiae. Allergic/Immunologic: No itchy/runny eyes, nasal congestion, recent allergic reactions, rashes  ALLERGIES: Allergies  Allergen Reactions   Epinephrine Shortness Of Breath and Swelling    During dental procedure     HOME MEDICATIONS:  Current Outpatient Medications:    aspirin EC 81 MG tablet, Take 81 mg by mouth daily., Disp: , Rfl:    cholecalciferol (VITAMIN D3) 25 MCG (1000 UNIT) tablet, Take 1,000 Units by mouth daily., Disp: , Rfl:    Cyanocobalamin (VITAMIN B 12 PO),  Take by mouth., Disp: , Rfl:    polyethylene glycol powder (GLYCOLAX /MIRALAX ) 17 GM/SCOOP powder, Take 17 g by mouth 2 (two) times daily as needed for mild constipation or moderate constipation., Disp: 3350 g, Rfl: 3   SYNTHROID  125 MCG tablet, TAKE 1 TABLET(125 MCG) BY MOUTH DAILY BEFORE BREAKFAST, Disp: 90 tablet, Rfl: 2   doxycycline  (MONODOX ) 100 MG capsule, Take 1 capsule (100 mg total) by mouth 2 (two) times daily. Take with food as directed (Patient not taking: Reported on 04/07/2024), Disp: 60 capsule, Rfl: 0   senna (SENOKOT) 8.6 MG tablet, Take 1 tablet (8.6 mg total) by mouth daily. (Patient not taking: Reported on 04/07/2024), Disp: 60 tablet, Rfl: 3  PAST MEDICAL HISTORY: Past Medical History:  Diagnosis Date  Allergy 05/26/2009   Allergic rhinitis   Basal cell carcinoma 01/17/2010   left upper lip/Moh's Dr. Bluford   Cancer [facility])    skin ca   Depression    H/O thyroidectomy 2006   Heart murmur    History of dysplastic nevus 12/11/2007   mid back/severe   Hyperlipidemia    Multiple sclerosis (HCC)    2008 diagnosis   Thyroid  disease     PAST SURGICAL HISTORY: Past Surgical History:  Procedure Laterality Date   BREAST BIOPSY Right 2014   bx/clip- neg   CHOLECYSTECTOMY  2000   COLONOSCOPY WITH PROPOFOL  N/A 11/14/2022   Procedure: COLONOSCOPY WITH PROPOFOL ;  Surgeon: Unk Corinn Skiff, MD;  Location: [facility];  Service: Endoscopy;  Laterality: N/A;   history of thyroidectomy  2008   POLYPECTOMY  11/14/2022   Procedure: POLYPECTOMY;  Surgeon: Unk Corinn Skiff, MD;  Location: [facility];  Service: Endoscopy;;    FAMILY HISTORY: Family History  Adopted: Yes  Problem Relation Age of Onset   Breast cancer Neg Hx    Multiple sclerosis Neg Hx     SOCIAL HISTORY:  Social History   Socioeconomic History   Marital status: Married    Spouse name: Not on file   Number of children: Not on file   Years of education: Not on file   Highest  education level: Bachelor's degree (e.g., BA, AB, BS)  Occupational History   Not on file  Tobacco Use   Smoking status: Former    Current packs/day: 0.00    Types: Cigarettes    Quit date: 07/29/1986    Years since quitting: 37.7   Smokeless tobacco: Never  Vaping Use   Vaping status: Never Used  Substance and Sexual Activity   Alcohol use: Yes    Alcohol/week: 1.0 standard drink of alcohol    Types: 1 Glasses of wine per week    Comment: occ   Drug use: No   Sexual activity: Not Currently  Other Topics Concern   Not on file  Social History Narrative   Caffeine tea 1.5 glasses daily. Education:  BA. Work previous Advertising account planner.   Disability with [facility].   Right handed   Lives at home with Noble   Social Drivers of Health   Financial Resource Strain: Patient Declined (03/16/2024)   Overall Financial Resource Strain (CARDIA)    Difficulty of Paying Living Expenses: Patient declined  Food Insecurity: Patient Declined (03/16/2024)   Hunger Vital Sign    Worried About Running Out of Food in the Last Year: Patient declined    Ran Out of Food in the Last Year: Patient declined  Transportation Needs: No Transportation Needs (03/16/2024)   PRAPARE - Administrator, Civil Service (Medical): No    Lack of Transportation (Non-Medical): No  Physical Activity: Unknown (03/16/2024)   Exercise Vital Sign    Days of Exercise per Week: Patient declined    Minutes of Exercise per Session: Not on file  Stress: Patient Declined (03/16/2024)   [employer] of Occupational Health - Occupational Stress Questionnaire    Feeling of Stress: Patient declined  Social Connections: Unknown (03/16/2024)   Social Connection and Isolation Panel    Frequency of Communication with Friends and Family: Patient declined    Frequency of Social Gatherings with Friends and Family: Patient declined    Attends Religious Services: Patient declined    Active Member of Clubs or Organizations:  Patient declined    Attends  Club or Organization Meetings: Not on file    Marital Status: Married  Intimate Partner Violence: Not At Risk (10/17/2023)   Humiliation, Afraid, Rape, and Kick questionnaire    Fear of Current or Ex-Partner: No    Emotionally Abused: No    Physically Abused: No    Sexually Abused: No     PHYSICAL EXAM  Vitals:   04/07/24 1558  BP: 126/67  Pulse: 67  Weight: 235 lb (106.6 kg)     Body mass index is 39.11 kg/m.   General: The patient is well-developed and well-nourished and in no acute distress  HEENT:  Head is [place]/AT.  Sclera are anicteric.   Skin: Extremities are without rash or  edema.  Neurologic Exam  Mental status: The patient is alert and oriented x 2 at the time of the examination. The patient has apparent normal recent and remote memory, with an apparently normal attention span and concentration ability.   Speech is normal.  Cranial nerves: Extraocular movements are full. Facial strength and sensation were symmetric.  SABRA No obvious hearing deficits are noted.  Motor:  Muscle bulk is normal.   Tone is increased in legs, left greater than right.  Strength was 4-4+/5 in the legs, a little worse on the left compared to the right..   Sensory: Sensory testing is intact to pinprick, soft touch and vibration sensation in arms and reduced vibration in legs, relative to the arms.  Coordination: Cerebellar testing reveals good finger-nose-finger and poor heel-to-shin .  Gait and station: Erica Noble needs support to stand up but can stay standing without support.  Erica Noble has extreme shuffling with Erica Noble gait but can stand up without support as Erica Noble tries to walk.  Erica Noble does not do much better holding Erica Noble hand.  Romberg was negative.  Reflexes: Deep tendon reflexes are symmetric and normal in arms and knees    DIAGNOSTIC DATA (LABS, IMAGING, TESTING) - I reviewed patient records, labs, notes, testing and imaging myself where available.  Lab Results   Component Value Date   WBC 6.4 09/18/2023   HGB 14.4 09/18/2023   HCT 41.2 09/18/2023   MCV 94 09/18/2023   PLT 213 09/18/2023      Component Value Date/Time   NA 142 09/18/2023 1534   K 4.1 09/18/2023 1534   CL 105 09/18/2023 1534   CO2 24 09/18/2023 1534   GLUCOSE 93 09/18/2023 1534   BUN 11 09/18/2023 1534   CREATININE 0.73 09/18/2023 1534   CALCIUM 9.1 09/18/2023 1534   PROT 6.7 09/18/2023 1534   ALBUMIN 4.3 09/18/2023 1534   AST 14 09/18/2023 1534   ALT 13 09/18/2023 1534   ALKPHOS 77 09/18/2023 1534   BILITOT 0.4 09/18/2023 1534   GFRNONAA 82 04/28/2019 1631   GFRAA 95 04/28/2019 1631   Lab Results  Component Value Date   CHOL 183 07/03/2023   HDL 60 07/03/2023   LDLCALC 110 (H) 07/03/2023   TRIG 68 07/03/2023   CHOLHDL 3.1 07/03/2023   Lab Results  Component Value Date   HGBA1C 5.6 07/03/2023   Lab Results  Component Value Date   VITAMINB12 532 07/03/2023   Lab Results  Component Value Date   TSH 1.180 07/03/2023       ASSESSMENT AND PLAN  Multiple sclerosis (HCC) - Plan: CBC with Differential/Platelet, IgG, IgA, IgM  Normal pressure hydrocephalus (HCC)  Malaise and fatigue  Gait disturbance  Cognitive deficit secondary to multiple sclerosis (HCC)  High risk medication use - Plan:  CBC with Differential/Platelet, IgG, IgA, IgM   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  The MRI in 2021 had showed additional lesions and the MRI in 2023 was stable.  Due to anxiety doing infusions, Erica Noble prefers not to continue Ocrevus.  We discussed options and I will have Erica Noble start Kesimpta.   I am concerned about possible normal pressure hydrocephalus --- the third and lateral ventricles are enlarged out of proportion to the extent of atrophy and the sylvian fissures are widened while there is crowding at the vertex --- a pattern typical for this disorder.  I discussed with Erica Noble and Erica Noble Noble that Erica Noble progressively worsening shuffling  gait could be due to combination of MS and NPH with the more recent changes being due to NPH.  I recommend a high-volume lumbar puncture to further evaluate.  Erica Noble prefers not to do a lumbar puncture but is going to reconsider.  Erica Noble will let us  know if Erica Noble wishes to proceed.   Rtc 6 months  40-minute office visit with the majority of the time spent face-to-face for history and physical, discussion/counseling and decision-making.  Additional time with record review and documentation.     Leona Pressly A. Vear, MD, [facility] 04/07/2024, 5:17 PM Certified in Neurology, Clinical Neurophysiology, Sleep Medicine and Neuroimaging  [facility] [address] [place], [place] [address] [phone]

## 2024-04-08 ENCOUNTER — Telehealth: Payer: Self-pay | Admitting: *Deleted

## 2024-04-08 ENCOUNTER — Ambulatory Visit: Payer: Self-pay | Admitting: Neurology

## 2024-04-08 LAB — CBC WITH DIFFERENTIAL/PLATELET
Basophils Absolute: 0.1 x10E3/uL (ref 0.0–0.2)
Basos: 1 %
EOS (ABSOLUTE): 0.1 x10E3/uL (ref 0.0–0.4)
Eos: 2 %
Hematocrit: 40.9 % (ref 34.0–46.6)
Hemoglobin: 13.7 g/dL (ref 11.1–15.9)
Immature Grans (Abs): 0 x10E3/uL (ref 0.0–0.1)
Immature Granulocytes: 0 %
Lymphocytes Absolute: 1.9 x10E3/uL (ref 0.7–3.1)
Lymphs: 27 %
MCH: 33 pg (ref 26.6–33.0)
MCHC: 33.5 g/dL (ref 31.5–35.7)
MCV: 99 fL — ABNORMAL HIGH (ref 79–97)
Monocytes Absolute: 0.7 x10E3/uL (ref 0.1–0.9)
Monocytes: 10 %
Neutrophils Absolute: 4.3 x10E3/uL (ref 1.4–7.0)
Neutrophils: 60 %
Platelets: 236 x10E3/uL (ref 150–450)
RBC: 4.15 x10E6/uL (ref 3.77–5.28)
RDW: 12.5 % (ref 11.7–15.4)
WBC: 7.1 x10E3/uL (ref 3.4–10.8)

## 2024-04-08 LAB — IGG, IGA, IGM
IgA/Immunoglobulin A, Serum: 218 mg/dL (ref 87–352)
IgG (Immunoglobin G), Serum: 886 mg/dL (ref 586–1602)
IgM (Immunoglobulin M), Srm: 43 mg/dL (ref 26–217)

## 2024-04-08 NOTE — Telephone Encounter (Signed)
Faxed completed/signed Kesimpta start form to [facility] at [phone]. Received fax confirmation.  

## 2024-04-08 NOTE — Telephone Encounter (Signed)
 [facility] Ofatumumab (Erica Noble)Form is missing your office fax number in Section 4. Please refax when completed.

## 2024-04-08 NOTE — Telephone Encounter (Signed)
 Re-Faxed pt's Kesimpta Start form to [phone], put fax number on form.

## 2024-04-12 NOTE — Telephone Encounter (Signed)
 I called [facility]. Spoke to [place], [place] rep. Completed PA via phone for Kesimpta. Can fax clinical notes to [phone] if needed. Ref # [id number]. Should have determination before 04/15/24.

## 2024-04-14 NOTE — Telephone Encounter (Signed)
 Received fax below.  Faxed recent office notes, MRI Brain report, lab results. Received fax confirmation.

## 2024-04-14 NOTE — Telephone Encounter (Signed)
 Faxed approval letter below to [facility] at [phone]. Received fax confirmation.

## 2024-04-15 NOTE — Telephone Encounter (Signed)
 I have let Roselind at [employer] know of this information.

## 2024-04-20 NOTE — Telephone Encounter (Signed)
 Called pt at [phone]. LVM for her to call office. Wanting to confirm if she received Kesimpta/started.

## 2024-05-24 ENCOUNTER — Ambulatory Visit
Admission: RE | Admit: 2024-05-24 | Discharge: 2024-05-24 | Disposition: A | Discharge status: Home / Self Care | Source: Ambulatory Visit | Attending: Obstetrics and Gynecology | Admitting: Obstetrics and Gynecology

## 2024-05-24 DIAGNOSIS — Z1231 Encounter for screening mammogram for malignant neoplasm of breast: Secondary | ICD-10-CM | POA: Insufficient documentation

## 2024-05-25 ENCOUNTER — Telehealth: Payer: Self-pay | Admitting: Family Medicine

## 2024-05-25 ENCOUNTER — Telehealth: Payer: Self-pay

## 2024-05-25 DIAGNOSIS — E039 Hypothyroidism, unspecified: Secondary | ICD-10-CM

## 2024-05-25 MED ORDER — SYNTHROID 125 MCG PO TABS: 125.0000 ug | ORAL_TABLET | Freq: Every day | ORAL | 2 refills | Status: DC

## 2024-05-25 NOTE — Telephone Encounter (Signed)
 Copied from CRM #[id number]. Topic: Clinical - Prescription Issue >> May 25, 2024  3:20 PM Sophia H wrote: Reason for CRM: Husband calling on behalf of patient, needing new RX sent to [facility] order for SYNTHROID  125 MCG tablet. Original RX sent to [facility], First time using this pharmacy. # [phone]

## 2024-05-25 NOTE — Telephone Encounter (Signed)
[facility] faxed refill request for the following medications: ? ? ?SYNTHROID 125 MCG tablet  ? ?Please advise. ? ?

## 2024-05-25 NOTE — Telephone Encounter (Signed)
 Pt has requested refill to soon. Pt should have enough medication refills til February 2026

## 2024-05-25 NOTE — Addendum Note (Signed)
 Addended by: WILFRED HARGIS RAMAN on: 05/25/2024 04:08 PM   Modules accepted: Orders

## 2024-05-25 NOTE — Telephone Encounter (Signed)
 Have submitted changes to prefer pharmacy, but the medication has printed out instead of sending. I called pt to verify the name of the pharmacy. No answer lvm to call back.

## 2024-05-26 ENCOUNTER — Other Ambulatory Visit: Payer: Self-pay | Admitting: Family Medicine

## 2024-05-26 DIAGNOSIS — E039 Hypothyroidism, unspecified: Secondary | ICD-10-CM

## 2024-05-26 MED ORDER — SYNTHROID 125 MCG PO TABS: 125.0000 ug | ORAL_TABLET | Freq: Every day | ORAL | 3 refills | Status: AC

## 2024-05-26 MED ORDER — SYNTHROID 125 MCG PO TABS: 125.0000 ug | ORAL_TABLET | Freq: Every day | ORAL | 2 refills | Status: DC

## 2024-05-26 NOTE — Addendum Note (Signed)
 Addended by: CHERRY CHIQUITA HERO on: 05/26/2024 03:26 PM   Modules accepted: Orders

## 2024-07-12 ENCOUNTER — Encounter: Payer: Self-pay | Admitting: Family Medicine

## 2024-07-12 ENCOUNTER — Ambulatory Visit: Admitting: Family Medicine

## 2024-07-12 VITALS — BP 132/70 | HR 58 | Ht 65.0 in | Wt 230.0 lb

## 2024-07-12 DIAGNOSIS — K5900 Constipation, unspecified: Secondary | ICD-10-CM

## 2024-07-12 DIAGNOSIS — E538 Deficiency of other specified B group vitamins: Secondary | ICD-10-CM | POA: Diagnosis not present

## 2024-07-12 DIAGNOSIS — Z23 Encounter for immunization: Secondary | ICD-10-CM

## 2024-07-12 DIAGNOSIS — G35D Multiple sclerosis, unspecified: Secondary | ICD-10-CM

## 2024-07-12 DIAGNOSIS — E559 Vitamin D deficiency, unspecified: Secondary | ICD-10-CM | POA: Diagnosis not present

## 2024-07-12 DIAGNOSIS — R0602 Shortness of breath: Secondary | ICD-10-CM

## 2024-07-12 DIAGNOSIS — E039 Hypothyroidism, unspecified: Secondary | ICD-10-CM | POA: Diagnosis not present

## 2024-07-12 DIAGNOSIS — K592 Neurogenic bowel, not elsewhere classified: Secondary | ICD-10-CM

## 2024-07-12 DIAGNOSIS — E782 Mixed hyperlipidemia: Secondary | ICD-10-CM

## 2024-07-12 NOTE — Assessment & Plan Note (Signed)
°  Vitamin B12 deficiency Chronic vitamin B12 deficiency managed with oral supplementation. - Continue vitamin B12 supplementation. - Checked vitamin B12 levels.

## 2024-07-12 NOTE — Assessment & Plan Note (Signed)
 Vitamin D  deficiency Chronic vitamin D  deficiency managed with vitamin D  1000 units daily. - Continue vitamin D  1000 units daily. - Checked vitamin D  levels.

## 2024-07-12 NOTE — Assessment & Plan Note (Signed)
 Chronic hypothyroidism managed with Synthroid  125 mcg daily. - Continue Synthroid  125 mcg daily. - Checked TSH and T4 levels.

## 2024-07-12 NOTE — Progress Notes (Signed)
 Established patient visit   Patient: Erica Noble   DOB: Jan 17, 1961   63 y.o. Female  MRN: [id number] Visit Date: 07/12/2024  Today's healthcare provider: Rockie Agent, MD   Chief Complaint  Patient presents with   Medical Management of Chronic Issues    Patient is present for follow up with PCP and flu shot   Subjective     HPI     Medical Management of Chronic Issues    Additional comments: Patient is present for follow up with PCP and flu shot      Last edited by Cherry Chiquita HERO, CMA on 07/12/2024  3:11 PM.       Discussed the use of AI scribe software for clinical note transcription with the patient, who gave verbal consent to proceed.  History of Present Illness Erica Noble is a 63 year old female with multiple sclerosis and hypothyroidism who presents for a chronic follow-up. She is accompanied by [name], her spouse & caregiver.  She has a history of multiple sclerosis, hypothyroidism, vitamin D  deficiency, hyperlipidemia, and vitamin B12 deficiency. Her current medications include aspirin 81 mg daily for hyperlipidemia, vitamin D  1000 units daily, vitamin B12 oral supplementation, and Synthroid  125 mcg daily for hypothyroidism.  She reports an elevated PHQ-9 score of 10, which has increased from 2 nine months ago. She experiences symptoms of brain fog, bladder issues, and constipation related to her MS. She has declined Ocrevus due to anxiety concerns and is considering Kesimpta as an alternative treatment.  She reports shuffling gait per her last neuro note. A high volume lumbar puncture was previously recommended, but she initially declined.  Her last neurology visit included blood work that showed a hemoglobin of 13. She has been experiencing constipation and is using Miralax  for relief. She reports occasional shortness of breath, but her oxygen saturation levels remain at 100%. No wheezing or snoring.  She has a follow-up with a dermatologist  in January and an obstetrician-gynecologist in May. Her last flu vaccine was administered today.     Past Medical History:  Diagnosis Date   Allergy 05/26/2009   Allergic rhinitis   Basal cell carcinoma 01/17/2010   left upper lip/Moh's Dr. Bluford   Cancer [facility])    skin ca   Depression    H/O thyroidectomy 2006   Heart murmur    History of dysplastic nevus 12/11/2007   mid back/severe   Hyperlipidemia    Multiple sclerosis    2008 diagnosis   Thyroid  disease     Medications: Show/hide medication list[1]  Review of Systems  Last thyroid  functions Lab Results  Component Value Date   TSH 1.180 07/03/2023   FREET4 1.54 07/03/2023        Objective    BP 132/70 (BP Location: Left Arm, Patient Position: Sitting, Cuff Size: Normal)   Pulse (!) 58   Ht 5' 5 (1.651 m)   Wt 230 lb (104.3 kg)   SpO2 100%   BMI 38.27 kg/m  BP Readings from Last 3 Encounters:  07/12/24 132/70  04/07/24 126/67  03/19/24 121/72   Wt Readings from Last 3 Encounters:  07/12/24 230 lb (104.3 kg)  04/07/24 235 lb (106.6 kg)  10/17/23 232 lb 1.6 oz (105.3 kg)        Physical Exam Vitals reviewed.  Constitutional:      General: She is not in acute distress.    Appearance: Normal appearance. She is not ill-appearing.  Cardiovascular:  Rate and Rhythm: Normal rate and regular rhythm.  Pulmonary:     Effort: Pulmonary effort is normal. No respiratory distress.     Breath sounds: No wheezing, rhonchi or rales.  Neurological:     Mental Status: She is alert and oriented to person, place, and time.  Psychiatric:        Mood and Affect: Mood normal.        Behavior: Behavior normal.      No results found for any visits on 07/12/24.   Assessment & Plan     Problem List Items Addressed This Visit     Acquired hypothyroidism   Chronic hypothyroidism managed with Synthroid  125 mcg daily. - Continue Synthroid  125 mcg daily. - Checked TSH and T4 levels.      Relevant Orders    TSH + free T4   Avitaminosis D   Vitamin D  deficiency Chronic vitamin D  deficiency managed with vitamin D  1000 units daily. - Continue vitamin D  1000 units daily. - Checked vitamin D  levels.      Relevant Orders   VITAMIN D  25 Hydroxy (Vit-D Deficiency, Fractures)   Constipation due to neurogenic bowel   Constipation due to neurogenic bowel Chronic constipation managed with Miralax . Reports increased impact and is using [employer] version of Miralax . - Continue Miralax  as needed, up to twice daily.      Relevant Orders   CMP14+EGFR   HLD (hyperlipidemia)   Chronic mixed hyperlipidemia managed with aspirin 81 mg daily. - Continue aspirin 81 mg daily.      Multiple sclerosis   Multiple sclerosis Chronic multiple sclerosis with brain fog, bladder issues, and constipation. Declined Ocrevus due to anxiety concerns. Considering Kesimpta, a self-injectable once a month, similar to Ocrevus. Concerns about normal pressure hydrocephalus affecting gait, with a potential high volume lumbar puncture to evaluate. Neurologist suggested a stent if improvement is noted post-lumbar puncture. Discussed potential benefits of Kesimpta and the possibility of a stent if lumbar puncture shows improvement. - Will consider starting Kesimpta for MS management. - Will consider high volume lumbar puncture to evaluate for normal pressure hydrocephalus. - Will discuss potential stent placement if improvement is noted post-lumbar puncture. -f/u with neuro as scheduled      Vitamin B12 deficiency    Vitamin B12 deficiency Chronic vitamin B12 deficiency managed with oral supplementation. - Continue vitamin B12 supplementation. - Checked vitamin B12 levels.      Relevant Orders   Vitamin B12   Other Visit Diagnoses       SOB (shortness of breath)    -  Primary     Immunization due       Relevant Orders   Flu vaccine trivalent PF, 6mos and older(Flulaval,Afluria,Fluarix,Fluzone) (Completed)        Assessment and Plan Assessment & Plan     Shortness of breath subacute Intermittent shortness of breath with normal oxygen saturation levels. No wheezing or signs of sleep apnea. Possible allergy-based or sedentary-related cause. Last chest x-ray in 2008. - Ordered chest x-ray to evaluate for atelectasis or other abnormalities. - Will consider pulmonary function tests if x-ray is inconclusive.   General Health Maintenance Received flu vaccine today. Recommended for tetanus and Macaco vaccines. - Administered flu vaccine. - Recommended tetanus and Macaco vaccines.     Return in about 4 months (around 11/10/2024) for Chronic F/U,SOB.         Rockie Agent, MD  [facility] [phone] (phone) [phone] (fax)  [facility]     [  1]  Outpatient Medications Prior to Visit  Medication Sig   aspirin EC 81 MG tablet Take 81 mg by mouth daily.   cholecalciferol (VITAMIN D3) 25 MCG (1000 UNIT) tablet Take 1,000 Units by mouth daily.   Cyanocobalamin (VITAMIN B 12 PO) Take by mouth.   polyethylene glycol powder (GLYCOLAX /MIRALAX ) 17 GM/SCOOP powder Take 17 g by mouth 2 (two) times daily as needed for mild constipation or moderate constipation.   SYNTHROID  125 MCG tablet Take 1 tablet (125 mcg total) by mouth daily before breakfast.   [DISCONTINUED] doxycycline  (MONODOX ) 100 MG capsule Take 1 capsule (100 mg total) by mouth 2 (two) times daily. Take with food as directed (Patient not taking: Reported on 04/07/2024)   [DISCONTINUED] senna (SENOKOT) 8.6 MG tablet Take 1 tablet (8.6 mg total) by mouth daily. (Patient not taking: Reported on 04/07/2024)   No facility-administered medications prior to visit.

## 2024-07-12 NOTE — Patient Instructions (Signed)
 To keep you healthy, please keep in mind the following health maintenance items that you are due for:   Health Maintenance Due  Topic Date Due   Zoster Vaccines- Shingrix  (1 of 2) Never done   Pneumococcal Vaccine: 50+ Years (1 of 1 - PCV) Never done   DTaP/Tdap/Td (2 - Td or Tdap) 12/31/2022     Best Wishes,   Dr. Lang

## 2024-07-12 NOTE — Assessment & Plan Note (Signed)
 Constipation due to neurogenic bowel Chronic constipation managed with Miralax . Reports increased impact and is using [employer] version of Miralax . - Continue Miralax  as needed, up to twice daily.

## 2024-07-12 NOTE — Assessment & Plan Note (Signed)
 Chronic mixed hyperlipidemia managed with aspirin 81 mg daily. - Continue aspirin 81 mg daily.

## 2024-07-12 NOTE — Assessment & Plan Note (Signed)
 Multiple sclerosis Chronic multiple sclerosis with brain fog, bladder issues, and constipation. Declined Ocrevus due to anxiety concerns. Considering Kesimpta, a self-injectable once a month, similar to Ocrevus. Concerns about normal pressure hydrocephalus affecting gait, with a potential high volume lumbar puncture to evaluate. Neurologist suggested a stent if improvement is noted post-lumbar puncture. Discussed potential benefits of Kesimpta and the possibility of a stent if lumbar puncture shows improvement. - Will consider starting Kesimpta for MS management. - Will consider high volume lumbar puncture to evaluate for normal pressure hydrocephalus. - Will discuss potential stent placement if improvement is noted post-lumbar puncture. -f/u with neuro as scheduled

## 2024-07-13 LAB — CMP14+EGFR
ALT: 15 IU/L (ref 0–32)
AST: 19 IU/L (ref 0–40)
Albumin: 4.3 g/dL (ref 3.9–4.9)
Alkaline Phosphatase: 76 IU/L (ref 49–135)
BUN/Creatinine Ratio: 15 (ref 12–28)
BUN: 12 mg/dL (ref 8–27)
Bilirubin Total: 0.4 mg/dL (ref 0.0–1.2)
CO2: 24 mmol/L (ref 20–29)
Calcium: 9.8 mg/dL (ref 8.7–10.3)
Chloride: 103 mmol/L (ref 96–106)
Creatinine, Ser: 0.8 mg/dL (ref 0.57–1.00)
Globulin, Total: 2.3 g/dL (ref 1.5–4.5)
Glucose: 103 mg/dL — ABNORMAL HIGH (ref 70–99)
Potassium: 4.5 mmol/L (ref 3.5–5.2)
Sodium: 142 mmol/L (ref 134–144)
Total Protein: 6.6 g/dL (ref 6.0–8.5)
eGFR: 83 mL/min/1.73 (ref >59)

## 2024-07-13 LAB — TSH+FREE T4
Free T4: 1.22 ng/dL (ref 0.82–1.77)
TSH: 3.54 u[IU]/mL (ref 0.450–4.500)

## 2024-07-13 LAB — VITAMIN B12: Vitamin B-12: 520 pg/mL (ref 232–1245)

## 2024-07-13 LAB — VITAMIN D 25 HYDROXY (VIT D DEFICIENCY, FRACTURES): Vit D, 25-Hydroxy: 58.1 ng/mL (ref 30.0–100.0)

## 2024-07-14 ENCOUNTER — Ambulatory Visit: Payer: Self-pay | Admitting: Family Medicine

## 2024-08-12 ENCOUNTER — Ambulatory Visit: Payer: Managed Care, Other (non HMO) | Admitting: Dermatology

## 2024-08-12 ENCOUNTER — Encounter: Payer: Self-pay | Admitting: Dermatology

## 2024-08-12 DIAGNOSIS — L578 Other skin changes due to chronic exposure to nonionizing radiation: Secondary | ICD-10-CM

## 2024-08-12 DIAGNOSIS — L814 Other melanin hyperpigmentation: Secondary | ICD-10-CM

## 2024-08-12 DIAGNOSIS — Z85828 Personal history of other malignant neoplasm of skin: Secondary | ICD-10-CM | POA: Diagnosis not present

## 2024-08-12 DIAGNOSIS — W908XXA Exposure to other nonionizing radiation, initial encounter: Secondary | ICD-10-CM | POA: Diagnosis not present

## 2024-08-12 DIAGNOSIS — Z86018 Personal history of other benign neoplasm: Secondary | ICD-10-CM | POA: Diagnosis not present

## 2024-08-12 DIAGNOSIS — D1801 Hemangioma of skin and subcutaneous tissue: Secondary | ICD-10-CM

## 2024-08-12 DIAGNOSIS — D229 Melanocytic nevi, unspecified: Secondary | ICD-10-CM

## 2024-08-12 DIAGNOSIS — L821 Other seborrheic keratosis: Secondary | ICD-10-CM | POA: Diagnosis not present

## 2024-08-12 DIAGNOSIS — Z1283 Encounter for screening for malignant neoplasm of skin: Secondary | ICD-10-CM

## 2024-08-12 NOTE — Patient Instructions (Signed)

## 2024-08-12 NOTE — Progress Notes (Signed)
" ° °  Follow-Up Visit   Subjective  Erica Noble is a 64 y.o. female who presents for the following: Skin Cancer Screening and Full Body Skin Exam. Hx of BCC. Hx of dysplastic nevus.   Check spot on right medial calf.   The patient presents for Total-Body Skin Exam (TBSE) for skin cancer screening and mole check. The patient has spots, moles and lesions to be evaluated, some may be new or changing and the patient may have concern these could be cancer.  Husband, [name], is with patient and contributes to history.   The following portions of the chart were reviewed this encounter and updated as appropriate: medications, allergies, medical history  Review of Systems:  No other skin or systemic complaints except as noted in HPI or Assessment and Plan.  Objective  Well appearing patient in no apparent distress; mood and affect are within normal limits.  A full examination was performed including scalp, head, eyes, ears, nose, lips, neck, chest, axillae, abdomen, back, buttocks, bilateral upper extremities, bilateral lower extremities, hands, feet, fingers, toes, fingernails, and toenails. All findings within normal limits unless otherwise noted below.   Relevant physical exam findings are noted in the Assessment and Plan.    Assessment & Plan   SKIN CANCER SCREENING PERFORMED TODAY.  HISTORY OF BASAL CELL CARCINOMA OF THE SKIN. Left upper lip. Mohs, Dr. Bluford. 01/17/2010. - No evidence of recurrence today - Recommend regular full body skin exams - Recommend daily broad spectrum sunscreen SPF 30+ to sun-exposed areas, reapply every 2 hours as needed.  - Call if any new or changing lesions are noted between office visits    HISTORY OF DYSPLASTIC NEVUS. Mid back, severe. 12/11/2007. No evidence of recurrence today Recommend regular full body skin exams Recommend daily broad spectrum sunscreen SPF 30+ to sun-exposed areas, reapply every 2 hours as needed.  Call if any new or changing  lesions are noted between office visits    ACTINIC DAMAGE - Chronic condition, secondary to cumulative UV/sun exposure - diffuse scaly erythematous macules with underlying dyspigmentation - Recommend daily broad spectrum sunscreen SPF 30+ to sun-exposed areas, reapply every 2 hours as needed.  - Staying in the shade or wearing long sleeves, sun glasses (UVA+UVB protection) and wide brim hats (4-inch brim around the entire circumference of the hat) are also recommended for sun protection.  - Call for new or changing lesions.  LENTIGINES, SEBORRHEIC KERATOSES, HEMANGIOMAS - Benign normal skin lesions - Benign-appearing - Call for any changes  MELANOCYTIC NEVI - Tan-brown and/or pink-flesh-colored symmetric macules and papules - Benign appearing on exam today - Observation - Call clinic for new or changing moles - Recommend daily use of broad spectrum spf 30+ sunscreen to sun-exposed areas.    SEBORRHEIC KERATOSIS - Stuck-on, waxy, tan-brown papules and/or plaques at right medial calf and left anterior thigh.  - Benign-appearing - Discussed benign etiology and prognosis. - Observe - Call for any changes       MULTIPLE BENIGN NEVI   LENTIGINES   ACTINIC ELASTOSIS   SEBORRHEIC KERATOSES   CHERRY ANGIOMA   Return in about 1 year (around 08/12/2025) for TBSE, HxBCC, HxDN.  I, Jill Parcell, CMA, am acting as scribe for Boneta Sharps, MD.   Documentation: I have reviewed the above documentation for accuracy and completeness, and I agree with the above.  Boneta Sharps, MD    "

## 2025-08-15 ENCOUNTER — Ambulatory Visit: Admitting: Dermatology
